# Patient Record
Sex: Male | Born: 1940 | Race: White | Hispanic: No | Marital: Married | Smoking: Former smoker
Health system: Southern US, Community
[De-identification: ages and names within clinical notes are randomized; demographics above are authoritative.]

## PROBLEM LIST (undated history)

## (undated) DIAGNOSIS — E7800 Pure hypercholesterolemia, unspecified: Secondary | ICD-10-CM

## (undated) DIAGNOSIS — I2510 Atherosclerotic heart disease of native coronary artery without angina pectoris: Secondary | ICD-10-CM

---

## 2013-01-14 ENCOUNTER — Other Ambulatory Visit: Payer: Self-pay

## 2013-01-14 ENCOUNTER — Emergency Department (HOSPITAL_COMMUNITY)
Admission: EM | Admit: 2013-01-14 | Discharge: 2013-01-15 | Disposition: A | Discharge status: Home / Self Care | Payer: 59 | Attending: Emergency Medicine | Admitting: Emergency Medicine

## 2013-01-14 ENCOUNTER — Encounter (HOSPITAL_COMMUNITY): Payer: Self-pay | Admitting: Emergency Medicine

## 2013-01-14 DIAGNOSIS — T63461A Toxic effect of venom of wasps, accidental (unintentional), initial encounter: Secondary | ICD-10-CM | POA: Insufficient documentation

## 2013-01-14 DIAGNOSIS — Z7982 Long term (current) use of aspirin: Secondary | ICD-10-CM | POA: Insufficient documentation

## 2013-01-14 DIAGNOSIS — Y9389 Activity, other specified: Secondary | ICD-10-CM | POA: Insufficient documentation

## 2013-01-14 DIAGNOSIS — Y92009 Unspecified place in unspecified non-institutional (private) residence as the place of occurrence of the external cause: Secondary | ICD-10-CM | POA: Insufficient documentation

## 2013-01-14 DIAGNOSIS — R21 Rash and other nonspecific skin eruption: Secondary | ICD-10-CM | POA: Insufficient documentation

## 2013-01-14 DIAGNOSIS — T782XXA Anaphylactic shock, unspecified, initial encounter: Secondary | ICD-10-CM

## 2013-01-14 DIAGNOSIS — Z87891 Personal history of nicotine dependence: Secondary | ICD-10-CM | POA: Insufficient documentation

## 2013-01-14 DIAGNOSIS — Z79899 Other long term (current) drug therapy: Secondary | ICD-10-CM | POA: Insufficient documentation

## 2013-01-14 DIAGNOSIS — T6391XA Toxic effect of contact with unspecified venomous animal, accidental (unintentional), initial encounter: Secondary | ICD-10-CM | POA: Insufficient documentation

## 2013-01-14 MED ORDER — FAMOTIDINE IN NACL 20-0.9 MG/50ML-% IV SOLN
20.0000 mg | Freq: Once | INTRAVENOUS | Status: AC
  Administered 2013-01-14: 20 mg via INTRAVENOUS
  Filled 2013-01-14: qty 50

## 2013-01-14 MED ORDER — EPINEPHRINE 0.3 MG/0.3ML IJ SOAJ: 0.3000 mg | INTRAMUSCULAR | Status: DC | PRN

## 2013-01-14 MED ORDER — PREDNISONE 20 MG PO TABS: ORAL_TABLET | ORAL | Status: DC

## 2013-01-14 MED ORDER — MORPHINE SULFATE 4 MG/ML IJ SOLN
4.0000 mg | Freq: Once | INTRAMUSCULAR | Status: AC
  Administered 2013-01-14: 4 mg via INTRAVENOUS
  Filled 2013-01-14: qty 1

## 2013-01-14 MED ORDER — FAMOTIDINE 40 MG PO TABS: 40.0000 mg | ORAL_TABLET | Freq: Two times a day (BID) | ORAL | Status: DC

## 2013-01-14 NOTE — ED Provider Notes (Signed)
CSN: [id number]     Arrival date & time 01/14/13  2055 History     First MD Initiated Contact with Patient 01/14/13 2114     Chief Complaint  Patient presents with  . Allergic Reaction   (Consider location/radiation/quality/duration/timing/severity/associated sxs/prior Treatment) HPI  Patient reports he had mowed the lawn and he was spreading the grass clippings around his azeleas when he suddenly was attacked by a bunch of ground bees. He states they followed into the house. He reports he was stung multiple times mainly on his arms. He relates about 5 minutes later he started feeling bad. His wife went to the drugstore to get Benadryl and when she came back 15 minutes later he was sitting in a chair and was able to take a Benadryl after which his eyes rolled back and one of his arms started shaking and his face got white and she thought his eyes looked swollen. She states that stopped and then it was repeated again. EMS was called. When they got there he was noted to have a blood pressure in the 70s with a heart rate of 50. They gave him a saline bolus and has received a total of 1700 cc of normal saline. He was given 50 mg of Benadryl prior to their arrival. EMS gave him 125 mg of Solu-Medrol, 50 mg of Zantac, another then 25 mg Benadryl and 8 mg of Zofran for nausea per my order while they were in route. Patient improved in transport. Patient states he remembers the events with his wife and does not think he had loss of consciousness. Wife is not sure. He states he's been stung before but never as many times or had a reaction before  History reviewed. No pertinent past medical history. History reviewed. No pertinent past surgical history. History reviewed. No pertinent family history. History  Substance Use Topics  . Smoking status: Former Smoker    Quit date: 06/04/1983  . Smokeless tobacco: Not on file  . 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    Allergies  Review of patient's allergies indicates no known allergies.  Home Medications   Current Outpatient Rx  Name  Route  Sig  Dispense  Refill  . aspirin EC 81 MG tablet   Oral   Take 81 mg by mouth daily.         . cyanocobalamin (,VITAMIN B-12,) 1000 MCG/ML injection   Intramuscular   Inject 1,000 mcg into the muscle once.         . Multiple Vitamin (MULTIVITAMIN WITH MINERALS) TABS tablet   Oral   Take 1 tablet by mouth daily.         . pravastatin (PRAVACHOL) 10 MG tablet   Oral   Take 10 mg by mouth every evening.         . Vitamin D, Ergocalciferol, (DRISDOL) 50000 UNITS CAPS capsule   Oral   Take 50,000 Units by mouth every 7 (seven) days. monday          BP 158/92  Pulse 82  Temp(Src) 97.5 F (36.4 C) (Oral)  Resp 18  SpO2 92%  Vital signs normal    Physical Exam  Nursing note and vitals reviewed. Constitutional: He is oriented to person, place, and time. He appears well-developed and well-nourished.  Non-toxic appearance. He does not appear ill. No distress.  HENT:  Head: Normocephalic and atraumatic.  Right Ear: External ear normal.  Left Ear: External ear normal.  Nose: Nose normal. No mucosal edema or rhinorrhea.  Mouth/Throat: Oropharynx is clear and moist and mucous membranes are normal. No dental abscesses or edematous.  Eyes: Conjunctivae and EOM are normal. Pupils are equal, round, and reactive to light.  Neck: Normal range of motion and full passive range of motion without pain. Neck supple.  Cardiovascular: Normal rate, regular rhythm and normal heart sounds.  Exam reveals no gallop and no friction rub.   No murmur heard. Pulmonary/Chest: Effort normal and breath sounds normal. No respiratory distress. He has no wheezes. He has no rhonchi. He has no rales. He exhibits no tenderness and no crepitus.  Abdominal: Soft. Normal appearance and bowel sounds are normal. He  exhibits no distension. There is no tenderness. There is no rebound and no guarding.  Musculoskeletal: Normal range of motion. He exhibits no edema and no tenderness.  Moves all extremities well.   Neurological: He is alert and oriented to person, place, and time. He has normal strength. No cranial nerve deficit.  Skin: Skin is warm, dry and intact. Rash noted. No erythema. No pallor.  Patient is noted to have coalescing redness of the skin mainly in his armpits, waistband, and knees, upper chest/neck area and scattered lesions on his face. He also has involvement of his arms with rare lesions on his lower leg except around the knees  Psychiatric: He has a normal mood and affect. His speech is normal and behavior is normal. His mood appears not anxious.    ED Course   Medications  famotidine (PEPCID) IVPB 20 mg (20 mg Intravenous New Bag/Given 01/14/13 2208)  morphine 4 MG/ML injection 4 mg (4 mg Intravenous Given 01/14/13 2208)     Procedures (including critical care time)  Recheck 22:30 sleeping, rash almost gone. Discussed using an epipen in the future.    Date: 01/14/2013  Rate: 83  Rhythm: normal sinus rhythm  QRS Axis: normal  Intervals: normal  ST/T Wave abnormalities: normal  Conduction Disutrbances:none  Narrative Interpretation:   Old EKG Reviewed: none available    1. Anaphylaxis, initial encounter   2. Bee sting, initial encounter     New Prescriptions   EPINEPHRINE (EPIPEN) 0.3 MG/0.3 ML SOAJ INJECTION    Inject 0.3 mL (0.3 mg total) into the muscle as needed.   FAMOTIDINE (PEPCID) 40 MG TABLET    Take 1 tablet (40 mg total) by mouth 2 (two) times daily.   PREDNISONE (DELTASONE) 20 MG TABLET    Take 3 po QD x 2d starting tomorrow, then 2 po QD x 3d then 1 po QD x 3d     Plan discharge   Devoria Albe, MD, FACEP   CRITICAL CARE Performed by: Devoria Albe L Total critical care time: 31 min Critical care time was exclusive of separately billable procedures and  treating other patients. Critical care was necessary to treat or prevent imminent or life-threatening deterioration. Critical care was time spent personally by me on the following activities: development of treatment plan with patient and/or surrogate as well as nursing, discussions with consultants, evaluation of patient's response to treatment, examination of patient, obtaining history from patient or surrogate, ordering and performing treatments and interventions, ordering and review of laboratory studies, ordering and review of radiographic studies, pulse oximetry and re-evaluation of patient's condition.     MDM    Ward Givens, MD 01/14/13 [phone]

## 2013-01-14 NOTE — ED Notes (Signed)
Pt arrived from home via EMS with a chief complaint of an allergic reaction to bee stings.  Pt stepped on a bee mound and was stung multiple times all over his body.  Per EMS upon arrival pt had a low blood pressure.   They administered of Normal saline before being able to get a blood pressure of 78/50 with a heart rate of 50.  Pt was continued with normal saline and has been given 1700 mL total and now has a BP of 127/80 and a heart rate of 82.  Pt was given 50,g PO Benadryl by his wife prior to arrival of EMS EMS administered 125mg  of solumedrol, 50mg  of zantac.  After contacting EDP they administered 25mg  benadryl and 8mg  Zofran.  Pt has welt marks on his body with a red rash.  Pt his able to talk and has a patent airway and appears in no apparent distress

## 2013-01-14 NOTE — ED Notes (Signed)
Bed: RESB Expected date:  Expected time:  Means of arrival:  Comments: EMS 72 yo M, multiple yellow jacket stings; Hives, IV hypotensive

## 2013-02-10 ENCOUNTER — Ambulatory Visit: Payer: Self-pay | Admitting: Gastroenterology

## 2013-06-10 ENCOUNTER — Ambulatory Visit: Payer: Self-pay | Admitting: Internal Medicine

## 2013-07-04 ENCOUNTER — Ambulatory Visit: Payer: Self-pay | Admitting: Internal Medicine

## 2013-08-01 ENCOUNTER — Ambulatory Visit: Payer: Self-pay | Admitting: Internal Medicine

## 2013-12-02 ENCOUNTER — Emergency Department (HOSPITAL_COMMUNITY): Payer: 59

## 2013-12-02 ENCOUNTER — Encounter (HOSPITAL_COMMUNITY): Payer: Self-pay | Admitting: Emergency Medicine

## 2013-12-02 ENCOUNTER — Emergency Department (HOSPITAL_COMMUNITY)
Admission: EM | Admit: 2013-12-02 | Discharge: 2013-12-02 | Disposition: A | Discharge status: Home / Self Care | Payer: 59 | Attending: Emergency Medicine | Admitting: Emergency Medicine

## 2013-12-02 DIAGNOSIS — S82009A Unspecified fracture of unspecified patella, initial encounter for closed fracture: Secondary | ICD-10-CM | POA: Insufficient documentation

## 2013-12-02 DIAGNOSIS — Y9389 Activity, other specified: Secondary | ICD-10-CM | POA: Insufficient documentation

## 2013-12-02 DIAGNOSIS — R296 Repeated falls: Secondary | ICD-10-CM | POA: Insufficient documentation

## 2013-12-02 DIAGNOSIS — Z79899 Other long term (current) drug therapy: Secondary | ICD-10-CM | POA: Insufficient documentation

## 2013-12-02 DIAGNOSIS — Z87891 Personal history of nicotine dependence: Secondary | ICD-10-CM | POA: Insufficient documentation

## 2013-12-02 DIAGNOSIS — S82002A Unspecified fracture of left patella, initial encounter for closed fracture: Secondary | ICD-10-CM

## 2013-12-02 DIAGNOSIS — Y929 Unspecified place or not applicable: Secondary | ICD-10-CM | POA: Insufficient documentation

## 2013-12-02 DIAGNOSIS — E7800 Pure hypercholesterolemia, unspecified: Secondary | ICD-10-CM | POA: Insufficient documentation

## 2013-12-02 HISTORY — DX: Pure hypercholesterolemia, unspecified: E78.00

## 2013-12-02 MED ORDER — HYDROCODONE-ACETAMINOPHEN 5-325 MG PO TABS: 1.0000 | ORAL_TABLET | Freq: Four times a day (QID) | ORAL | Status: DC | PRN

## 2013-12-02 NOTE — ED Notes (Signed)
Called Ortho to bedside for knee immoolizer

## 2013-12-02 NOTE — ED Provider Notes (Signed)
CSN: [id number]     Arrival date & time 12/02/13  1808 History   First MD Initiated Contact with Patient 12/02/13 1822     Chief Complaint  Patient presents with  . Knee Pain     (Consider location/radiation/quality/duration/timing/severity/associated sxs/prior Treatment) The history is provided by the patient. No language interpreter was used.  Richard Mclaughlin is a 73 y/o M with PMhx of high cholesterol presenting to the ED with left knee pain after a fall that occurred this morning. Patient reported that while he was taking out the trash he turned quickly, resulting in him to fall down 3 steps and land directly on his left knee on concrete. Patient described the left knee discomfort as a 5/10 when resting, but stated that when he flexes the knee and bears weight is when the pain is the worst and is described as a pulling sensation. Patient reported that he went to work after the event, but stated that he came home secondary to increased pain. Denied head injury, LOC, blurred vision, sudden loss of vision, neck pain, back pain, numbness, tingling, weakness. PCP Dr. Harrington [facility]  Past Medical History  Diagnosis Date  . High cholesterol    History reviewed. No pertinent past surgical history. No family history on file. History  Substance Use Topics  . Smoking status: Former Smoker    Quit date: 06/04/1983  . Smokeless tobacco: Not on file  . Alcohol Use: Yes    Review of Systems  Eyes: Negative for visual disturbance.  Musculoskeletal: Positive for arthralgias (left knee pain ). Negative for gait problem and neck pain.  Neurological: Negative for dizziness, weakness, numbness and headaches.      Allergies  Review of patient's allergies indicates no known allergies.  Home Medications   Prior to Admission medications   Medication Sig Start Date End Date Taking? Authorizing Provider  cyanocobalamin (,VITAMIN B-12,) 1000 MCG/ML injection Inject 1,000 mcg into the muscle once.   Yes  Historical Provider, MD  EPINEPHrine (EPIPEN) 0.3 mg/0.3 mL SOAJ injection Inject 0.3 mL (0.3 mg total) into the muscle as needed. 01/14/13  Yes Ward GivensIva L Knapp, MD  Multiple Vitamin (MULTIVITAMIN WITH MINERALS) TABS tablet Take 1 tablet by mouth daily.   Yes Historical Provider, MD  pravastatin (PRAVACHOL) 10 MG tablet Take 10 mg by mouth every evening.   Yes Historical Provider, MD  Vitamin D, Ergocalciferol, (DRISDOL) 50000 UNITS CAPS capsule Take 50,000 Units by mouth every 7 (seven) days.   Yes Historical Provider, MD  HYDROcodone-acetaminophen (NORCO/VICODIN) 5-325 MG per tablet Take 1 tablet by mouth every 6 (six) hours as needed for moderate pain or severe pain. 12/02/13   Kahealani Yankovich, PA-C   BP 129/70  Pulse 74  Temp(Src) 98.6 F (37 C) (Oral)  Resp 16  SpO2 93% Physical Exam  Nursing note and vitals reviewed. Constitutional: He is oriented to person, place, and time. He appears well-developed and well-nourished. No distress.  HENT:  Head: Normocephalic and atraumatic.  Eyes: Conjunctivae and EOM are normal. Right eye exhibits no discharge. Left eye exhibits no discharge.  Neck: Normal range of motion. Neck supple.  Cardiovascular: Normal rate, regular rhythm and normal heart sounds.  Exam reveals no friction rub.   No murmur heard. Pulses:      Radial pulses are 2+ on the right side, and 2+ on the left side.       Dorsalis pedis pulses are 2+ on the right side, and 2+ on the left side.  Cap  refill < 3 seconds Skin warm to touch. Negative necrotic tissue noted.   Pulmonary/Chest: Effort normal and breath sounds normal. No respiratory distress. He has no wheezes. He has no rales.  Musculoskeletal: He exhibits edema and tenderness.       Left knee: He exhibits decreased range of motion, swelling and deformity. He exhibits no effusion, no ecchymosis, no laceration, no erythema, normal alignment and no LCL laxity. Tenderness found. Patellar tendon tenderness noted.        Legs: Swelling noted to the suprapatellar region of the left knee with negative ecchymosis, erythema, inflammation, warmth upon palpation. Mild deformity noted with the patella slightly inferior position when compared to the right knee. Patient is able to flex and extend, most comfortable when the knee is extended. Valgus and varus tension negative. Negative anterior and posterior draw sign. Patient able to flex the left hip - discomfort with flexion of the left hip noted. Full ROM to the left ankle, digits of the left foot.  Neurological: He is alert and oriented to person, place, and time. No cranial nerve deficit. He exhibits normal muscle tone. Coordination normal.  Cranial nerves III-XII grossly intact Strength 5+/5+ to lower extremities bilaterally with resistance applied, equal distribution noted Strength intact to digits of the feet bilaterally Sensation intact with differentiation to sharp and dull touch Negative facial drooping Negative slurred speech  Negative aphasia  Skin: Skin is warm and dry. No rash noted. He is not diaphoretic. No erythema.  Psychiatric: He has a normal mood and affect. His behavior is normal. Thought content normal.    ED Course  Procedures (including critical care time)  7:58 PM Patient seen and assessed by attending physician, Dr. Cheri Rous. Yao who recommended patient to be placed in knee immobilizer and for patient to be discharged home with pain medications. Recommended patient to be seen by Orthopedics as outpatient.   Labs Review Labs Reviewed - No data to display  Imaging Review Dg Knee Complete 4 Views Left  12/02/2013   CLINICAL DATA:  Left knee pain  EXAM: LEFT KNEE - COMPLETE 4+ VIEW  COMPARISON:  None.  FINDINGS: Minimally displaced vertical fracture along the lateral aspect of the patella.  No additional fracture is seen.  Associated large suprapatellar knee joint effusion.  IMPRESSION: Minimally displaced vertical fracture along the lateral aspect of  the patella.  Associated large suprapatellar knee joint effusion.   Electronically Signed   By: Charline BillsSriyesh  Krishnan M.D.   On: 12/02/2013 19:44     EKG Interpretation None      MDM   Final diagnoses:  Patella fracture, left, closed, initial encounter    Medications - No data to display  Filed Vitals:   12/02/13 1817 12/02/13 2055  BP: 152/63 129/70  Pulse: 81 74  Temp: 98.4 F (36.9 C) 98.6 F (37 C)  TempSrc: Oral Oral  Resp: 18 16  SpO2: 96% 93%   Patient presenting to the ED after a fall that occurred this morning, resulting in left knee pain. Denied head injury, LOC, blurred vision, sudden loss of vision.  Plain film of left knee noted minimally displaced vertical fracture along the lateral aspect of the patella with associated large suprapatellar joint effusion. Negative focal neurological deficits noted. Sensation intact. Pulses palpable and strong-DP and PT. Cap refill less than 3 seconds. Full range of motion to the left ankle and digits of left foot. Sensation intact. Warmth upon palpation to skin with negative skin color changes. Patient seen and assessed  by attending physician, Dr. Cheri Rous who recommended patient to be discharged home with a knee immobilizer and for patient to follow up as outpatient with Orthopedics.  Doubt compartment syndrome. Doubt ischemia. Patient presenting with fracture to the left patella - minimally displaced. Patient placed in knee immobilizer with crutches. Patient stable, afebrile. Patient not septic appearing. Discharged patient. Discussed with patient to rest, ice, elevate. Discussed with patient to keep knee in immobilizer at all times. Referred to PCP and orthopedics. Discussed with patient to avoid any physical or strenuous activity. Discussed with patient to closely monitor symptoms and if symptoms are to worsen or change to report back to the ED - strict return instructions given.  Patient agreed to plan of care, understood, all questions  answered.   Raymon Mutton, PA-C 12/03/13 [phone]

## 2013-12-02 NOTE — ED Notes (Signed)
Pt was taking out two bags of trash earlier today and was trying to open the garage door by pushing the button and fell down few stairs.  Pt c/o left knee pain.  Pt went on to work but left due to pain. Pt went home and rested with ice pack but pain is still persisting.

## 2013-12-02 NOTE — Discharge Instructions (Signed)
Please call your doctor for a followup appointment within 24-48 hours. When you talk to your doctor please let them know that you were seen in the emergency department and have them acquire all of your records so that they can discuss the findings with you and formulate a treatment plan to fully care for your new and ongoing problems. Please call and set-up an appointment with your primary care provider Please call and set-up an appointment with Orthopedics - please call on Monday morning Please take pain medications as prescribed - while on pain medications there is to be no drinking alcohol, driving, operating any heavy machinery. If extra please dispose in a proper manner. Please do not take any extra Tylenol with this medication for this can lead to Tylenol overdose and liver issues.  Please rest and stay hydrated Please rest, ice, and elevate Please keep knee in immobilizer at all times Please do not bear weight on the leg Please no physical or strenuous activity Please continue to monitor symptoms closely and if symptoms are to worsen or change (fever greater than 101, chills, sweating, nausea, vomiting, chest pain, shortness of breath, difficulty breathing, numbness, tingling, loss of sensation, worsening or changes to pain pattern, changes to skin color of the knee and toes, loss of sensation, fall, injury) please report back to the ED immediately    Knee Fracture, Adult A knee fracture is a break in any of the bones of the lower part of the thigh bone, the upper part of the bones of the lower leg, or of the kneecap. When the bones no longer meet the way they are supposed to it is called a dislocation. Sometimes there can be a dislocation along with fractures. SYMPTOMS  Symptoms may include pain, swelling, inability to bend the knee, deformity of the knee, and inability to walk.  DIAGNOSIS  This problem is usually diagnosed with x-rays. Special studies are sometimes done if a fracture is  suspected but cannot be seen on ordinary x-rays. If vessels around the knee are injured, special tests may be done to see what the damage is. TREATMENT   The leg is usually splinted for the first couple of days to allow for swelling. After the swelling is down a cast is put on. Sometimes a cast is put on right away with the sides of the cast cut to allow the knee to swell. If the bones are in place, this may be all that is needed.  If the bones are out of place, medications for pain are given to allow them to be put back in place. If they are seriously out of place, surgery may be needed to hold the pieces or breaks in place using wires, pins, screws or metal plates.  Generally most fractures will heal in 4 to 6 weeks. HOME CARE INSTRUCTIONS   Use your crutches as directed.  To lessen the swelling, keep the injured leg elevated while sitting or lying down.  Apply ice to the injury for 15-20 minutes, 03-04 times per day while awake for 2 days. Put the ice in a plastic bag and place a thin towel between the bag of ice and your cast.  If you have a plaster or fiberglass cast:  Do not try to scratch the skin under the cast using sharp or pointed objects.  Check the skin around the cast every day. You may put lotion on any red or sore areas.  Keep your cast dry and clean.  If you have  a plaster splint:  Wear the splint as directed.  You may loosen the elastic around the splint if your toes become numb, tingle, or turn cold or blue.  Do not put pressure on any part of your cast or splint; it may break. Rest your cast only on a pillow the first 24 hours until it is fully hardened.  Your cast or splint can be protected during bathing with a plastic bag. Do not lower the cast or splint into water.  Only take over-the-counter or prescription medicines for pain, discomfort, or fever as directed by your caregiver.  See your caregiver soon if your cast gets damaged or breaks.  It is very  important to keep all follow up appointments. Not following up as directed may result in a worsening of your condition or a failure of the fracture to heal properly. SEEK IMMEDIATE MEDICAL CARE IF:  You have continued severe pain.  You have more swelling than you did before the cast was put on.  The area below the fracture becomes painful.  Your skin or toenails below the injury turn blue or gray, or feel cold or numb.  There is drainage coming from under the cast.  New, unexplained symptoms develop (drugs used in treatment may produce side effects). MAKE SURE YOU:   Understand these instructions.  Will watch your condition.  Will get help right away if you are not doing well or get worse. Document Released: 04/02/2006 Document Revised: 08/12/2011 Document Reviewed: 05/04/2007 [facility] Patient Information 2015 [employer], [place]. This information is not intended to replace advice given to you by your health care provider. Make sure you discuss any questions you have with your health care provider.

## 2013-12-06 NOTE — ED Provider Notes (Signed)
Medical screening examination/treatment/procedure(s) were conducted as a shared visit with non-physician practitioner(s) and myself.  I personally evaluated the patient during the encounter.   EKG Interpretation None      [name] is a 73 y.o. male here with L knee pain. Walking and fell on L knee. On exam, obvious effusion L knee. Able to lift leg off the bed. Tenderness on patella. Xray showed patella fracture. Patient placed in knee immobilizer and crutches. Will give ortho f/u.    Richardean Canalavid H Alyssabeth Bruster, MD 12/06/13 1556

## 2015-10-02 ENCOUNTER — Emergency Department (HOSPITAL_COMMUNITY)
Admission: EM | Admit: 2015-10-02 | Discharge: 2015-10-02 | Disposition: A | Discharge status: Home / Self Care | Payer: 59 | Attending: Emergency Medicine | Admitting: Emergency Medicine

## 2015-10-02 ENCOUNTER — Emergency Department (HOSPITAL_COMMUNITY): Payer: 59

## 2015-10-02 ENCOUNTER — Encounter (HOSPITAL_COMMUNITY): Payer: Self-pay

## 2015-10-02 DIAGNOSIS — Z79899 Other long term (current) drug therapy: Secondary | ICD-10-CM | POA: Diagnosis not present

## 2015-10-02 DIAGNOSIS — Z87891 Personal history of nicotine dependence: Secondary | ICD-10-CM | POA: Diagnosis not present

## 2015-10-02 DIAGNOSIS — N201 Calculus of ureter: Secondary | ICD-10-CM | POA: Insufficient documentation

## 2015-10-02 DIAGNOSIS — E7800 Pure hypercholesterolemia, unspecified: Secondary | ICD-10-CM | POA: Insufficient documentation

## 2015-10-02 DIAGNOSIS — R1032 Left lower quadrant pain: Secondary | ICD-10-CM | POA: Diagnosis present

## 2015-10-02 DIAGNOSIS — Z7982 Long term (current) use of aspirin: Secondary | ICD-10-CM | POA: Diagnosis not present

## 2015-10-02 DIAGNOSIS — Z79891 Long term (current) use of opiate analgesic: Secondary | ICD-10-CM | POA: Diagnosis not present

## 2015-10-02 LAB — CBC WITH DIFFERENTIAL/PLATELET
Basophils Absolute: 0 10*3/uL (ref 0.0–0.1)
Basophils Relative: 0 %
Eosinophils Absolute: 0 10*3/uL (ref 0.0–0.7)
Eosinophils Relative: 0 %
HEMATOCRIT: 42.3 % (ref 39.0–52.0)
HEMOGLOBIN: 14.9 g/dL (ref 13.0–17.0)
LYMPHS ABS: 1.4 10*3/uL (ref 0.7–4.0)
Lymphocytes Relative: 13 %
MCH: 31.8 pg (ref 26.0–34.0)
MCHC: 35.2 g/dL (ref 30.0–36.0)
MCV: 90.4 fL (ref 78.0–100.0)
MONO ABS: 0.4 10*3/uL (ref 0.1–1.0)
MONOS PCT: 4 %
NEUTROS ABS: 8.5 10*3/uL — AB (ref 1.7–7.7)
Neutrophils Relative %: 83 %
Platelets: 198 10*3/uL (ref 150–400)
RBC: 4.68 MIL/uL (ref 4.22–5.81)
RDW: 12.7 % (ref 11.5–15.5)
WBC: 10.3 10*3/uL (ref 4.0–10.5)

## 2015-10-02 LAB — COMPREHENSIVE METABOLIC PANEL
ALT: 21 U/L (ref 17–63)
ANION GAP: 13 (ref 5–15)
AST: 30 U/L (ref 15–41)
Albumin: 4.4 g/dL (ref 3.5–5.0)
Alkaline Phosphatase: 64 U/L (ref 38–126)
BUN: 24 mg/dL — ABNORMAL HIGH (ref 6–20)
CHLORIDE: 104 mmol/L (ref 101–111)
CO2: 24 mmol/L (ref 22–32)
Calcium: 9.3 mg/dL (ref 8.9–10.3)
Creatinine, Ser: 1.44 mg/dL — ABNORMAL HIGH (ref 0.61–1.24)
GFR calc Af Amer: 54 mL/min — ABNORMAL LOW (ref >60)
GFR calc non Af Amer: 46 mL/min — ABNORMAL LOW (ref >60)
Glucose, Bld: 166 mg/dL — ABNORMAL HIGH (ref 65–99)
Potassium: 4 mmol/L (ref 3.5–5.1)
SODIUM: 141 mmol/L (ref 135–145)
Total Bilirubin: 1 mg/dL (ref 0.3–1.2)
Total Protein: 7.7 g/dL (ref 6.5–8.1)

## 2015-10-02 LAB — URINALYSIS, ROUTINE W REFLEX MICROSCOPIC
BILIRUBIN URINE: NEGATIVE
GLUCOSE, UA: 100 mg/dL — AB
HGB URINE DIPSTICK: NEGATIVE
KETONES UR: 40 mg/dL — AB
Leukocytes, UA: NEGATIVE
Nitrite: NEGATIVE
PH: 5.5 (ref 5.0–8.0)
Protein, ur: NEGATIVE mg/dL
SPECIFIC GRAVITY, URINE: 1.019 (ref 1.005–1.030)

## 2015-10-02 MED ORDER — TAMSULOSIN HCL 0.4 MG PO CAPS: 0.4000 mg | ORAL_CAPSULE | Freq: Every day | ORAL | Status: DC

## 2015-10-02 MED ORDER — DIATRIZOATE MEGLUMINE & SODIUM 66-10 % PO SOLN
30.0000 mL | Freq: Once | ORAL | Status: AC
  Administered 2015-10-02: 30 mL via ORAL

## 2015-10-02 MED ORDER — SODIUM CHLORIDE 0.9 % IV BOLUS (SEPSIS)
1000.0000 mL | Freq: Once | INTRAVENOUS | Status: AC
  Administered 2015-10-02: 1000 mL via INTRAVENOUS

## 2015-10-02 MED ORDER — HYDROMORPHONE HCL 1 MG/ML IJ SOLN
1.0000 mg | Freq: Once | INTRAMUSCULAR | Status: AC
  Administered 2015-10-02: 1 mg via INTRAVENOUS
  Filled 2015-10-02: qty 1

## 2015-10-02 MED ORDER — PROCHLORPERAZINE EDISYLATE 5 MG/ML IJ SOLN
10.0000 mg | Freq: Once | INTRAMUSCULAR | Status: AC
  Administered 2015-10-02: 10 mg via INTRAVENOUS
  Filled 2015-10-02: qty 2

## 2015-10-02 MED ORDER — ONDANSETRON HCL 4 MG PO TABS: 4.0000 mg | ORAL_TABLET | Freq: Four times a day (QID) | ORAL | Status: DC

## 2015-10-02 MED ORDER — OXYCODONE-ACETAMINOPHEN 5-325 MG PO TABS: 1.0000 | ORAL_TABLET | ORAL | Status: DC | PRN

## 2015-10-02 MED ORDER — OXYCODONE-ACETAMINOPHEN 5-325 MG PO TABS: 2.0000 | ORAL_TABLET | ORAL | Status: DC | PRN

## 2015-10-02 MED ORDER — MORPHINE SULFATE (PF) 4 MG/ML IV SOLN
4.0000 mg | Freq: Once | INTRAVENOUS | Status: AC
  Administered 2015-10-02: 4 mg via INTRAVENOUS
  Filled 2015-10-02: qty 1

## 2015-10-02 NOTE — ED Notes (Addendum)
Per EMS-LLQ pain for 2 hours-N/V-urinary urgency-last void was at 1200 pm-4 mg of Zofran given in route

## 2015-10-02 NOTE — ED Provider Notes (Signed)
CSN: [id number]     Arrival date & time 10/02/15  1352 History   First MD Initiated Contact with Patient 10/02/15 1356     Chief Complaint  Patient presents with  . Abdominal Pain   PT DEVELOPED LLQ PAIN ABOUT 2 HRS PTA.  HE ALSO FELT LIKE HE CAN'T URINATE.  HE WAS GIVEN 4 MG OF ZOFRAN BY EMS AND THAT HAS NOT HELPED HIS N/V.  THE PT HAS NEVER HAD ANYTHING LIKE THIS IN THE PAST.  (Consider location/radiation/quality/duration/timing/severity/associated sxs/prior Treatment) Patient is a 75 y.o. male presenting with abdominal pain. The history is provided by the patient and the EMS personnel.  Abdominal Pain Pain location:  L flank and LLQ Pain quality: sharp   Pain radiates to:  Does not radiate Pain severity:  Moderate Onset quality:  Sudden Timing:  Constant Progression:  Worsening Associated symptoms: nausea and vomiting     Past Medical History  Diagnosis Date  . High cholesterol    History reviewed. No pertinent past surgical history. History reviewed. No pertinent family history. Social History  Substance Use Topics  . Smoking status: Former Smoker    Quit date: 06/04/1983  . Smokeless tobacco: None  . Alcohol Use: Yes    Review of Systems  Gastrointestinal: Positive for nausea, vomiting and abdominal pain.  All other systems reviewed and are negative.     Allergies  Azithromycin and Simvastatin  Home Medications   Prior to Admission medications   Medication Sig Start Date End Date Taking? Authorizing Provider  aspirin EC 81 MG tablet Take 81 mg by mouth daily.   Yes Historical Provider, MD  cyanocobalamin (,VITAMIN B-12,) 1000 MCG/ML injection Inject 1,000 mcg into the muscle every 30 (thirty) days.    Yes Historical Provider, MD  EPINEPHrine (EPIPEN) 0.3 mg/0.3 mL SOAJ injection Inject 0.3 mL (0.3 mg total) into the muscle as needed. 01/14/13  Yes Devoria AlbeIva Knapp, MD  Multiple Vitamin (MULTIVITAMIN WITH MINERALS) TABS tablet Take 1 tablet by mouth daily.   Yes  Historical Provider, MD  pravastatin (PRAVACHOL) 10 MG tablet Take 10 mg by mouth every evening.   Yes Historical Provider, MD  Vitamin D, Ergocalciferol, (DRISDOL) 50000 units CAPS capsule Take 50,000 Units by mouth every 14 (fourteen) days. 09/30/15  Yes Historical Provider, MD  oxyCODONE-acetaminophen (PERCOCET/ROXICET) 5-325 MG tablet Take 1 tablet by mouth every 4 (four) hours as needed for severe pain. 10/02/15   Jacalyn LefevreJulie Aerielle Stoklosa, MD  tamsulosin (FLOMAX) 0.4 MG CAPS capsule Take 1 capsule (0.4 mg total) by mouth daily. 10/02/15   Jacalyn LefevreJulie Mariama Saintvil, MD   BP 189/88 mmHg  Pulse 54  Temp(Src) 98.1 F (36.7 C) (Oral)  Resp 14  SpO2 97% Physical Exam  Constitutional: He is oriented to person, place, and time. He appears well-developed and well-nourished.  HENT:  Head: Normocephalic and atraumatic.  Right Ear: External ear normal.  Left Ear: External ear normal.  Nose: Nose normal.  Mouth/Throat: Oropharynx is clear and moist.  Eyes: Conjunctivae and EOM are normal. Pupils are equal, round, and reactive to light.  Neck: Normal range of motion. Neck supple.  Cardiovascular: Normal rate, regular rhythm, normal heart sounds and intact distal pulses.   Pulmonary/Chest: Effort normal and breath sounds normal.  Abdominal: Soft. Bowel sounds are normal. There is tenderness in the left lower quadrant.  Musculoskeletal: Normal range of motion.  Neurological: He is alert and oriented to person, place, and time.  Skin: Skin is warm and dry.  Psychiatric: He has a normal  mood and affect. His behavior is normal. Thought content normal.  Nursing note and vitals reviewed.   ED Course  Procedures (including critical care time) Labs Review Labs Reviewed  COMPREHENSIVE METABOLIC PANEL - Abnormal; Notable for the following:    Glucose, Bld 166 (*)    BUN 24 (*)    Creatinine, Ser 1.44 (*)    GFR calc non Af Amer 46 (*)    GFR calc Af Amer 54 (*)    All other components within normal limits  CBC WITH  DIFFERENTIAL/PLATELET - Abnormal; Notable for the following:    Neutro Abs 8.5 (*)    All other components within normal limits  URINALYSIS, ROUTINE W REFLEX MICROSCOPIC (NOT AT [facility]) - Abnormal; Notable for the following:    Glucose, UA 100 (*)    Ketones, ur 40 (*)    All other components within normal limits    Imaging Review Ct Abdomen Pelvis Wo Contrast  10/02/2015  CLINICAL DATA:  Left lower quadrant pain for 2 hours. EXAM: CT ABDOMEN AND PELVIS WITHOUT CONTRAST TECHNIQUE: Multidetector CT imaging of the abdomen and pelvis was performed following the standard protocol without IV contrast. COMPARISON:  None. FINDINGS: Lower chest: There is pulmonary vascular congestion at the lung bases but there is no pulmonary edema and there are no effusions. Heart size is normal. Hepatobiliary: 7 mm low-density lesion in the dome of the right lobe of the liver, probably a benign cyst. Liver parenchyma is otherwise normal. Biliary tree is normal. Pancreas: Normal. Spleen: Normal. Adrenals/Urinary Tract: 3 mm stone obstructing the left ureter at the left ureterovesical junction. Perinephric soft tissue stranding extending along the slightly distended left ureter into the left side of the pelvis. No renal calculi. The adrenal glands are normal. Bladder is normal. Stomach/Bowel: Normal. Vascular/Lymphatic: Aortic atherosclerosis.  No adenopathy. Reproductive: Negative. Other: No free air. Musculoskeletal: No significant abnormality. IMPRESSION: 3 mm stone at the left ureterovesical junction creating left hydronephrosis and extravasation of urine along the left ureter. Slight pulmonary vascular congestion the lung bases. Aortic atherosclerosis. Electronically Signed   By: Francene Boyers M.D.   On: 10/02/2015 15:38   I have personally reviewed and evaluated these images and lab results as part of my medical decision-making.   EKG Interpretation None      MDM  PT'S PAIN IS GONE.  HE KNOWS TO RETURN IF WORSE.   PT D/W DR. Sherryl Barters (UROLOGY) ABOUT CT SCAN SHOWING EXTRAVASATION OF URINE.  HE SAID THAT IF PT'S PAIN IS IMPROVED, OK TO F/U AS AN OUTPATIENT.    Final diagnoses:  Left ureteral stone      Jacalyn Lefevre, MD 10/02/15 1731

## 2015-10-02 NOTE — ED Notes (Signed)
Pt is aware we need a urine sample. He states he hasnt been able to go since around lunch time.

## 2015-10-02 NOTE — ED Notes (Signed)
Family member requesting more pain med for patient. Provider notified. Awaiting orders.

## 2015-10-02 NOTE — Discharge Instructions (Signed)
Kidney Stones °Kidney stones (urolithiasis) are deposits that form inside your kidneys. The intense pain is caused by the stone moving through the urinary tract. When the stone moves, the ureter goes into spasm around the stone. The stone is usually passed in the urine.  °CAUSES  °· A disorder that makes certain neck glands produce too much parathyroid hormone (primary hyperparathyroidism). °· A buildup of uric acid crystals, similar to gout in your joints. °· Narrowing (stricture) of the ureter. °· A kidney obstruction present at birth (congenital obstruction). °· Previous surgery on the kidney or ureters. °· Numerous kidney infections. °SYMPTOMS  °· Feeling sick to your stomach (nauseous). °· Throwing up (vomiting). °· Blood in the urine (hematuria). °· Pain that usually spreads (radiates) to the groin. °· Frequency or urgency of urination. °DIAGNOSIS  °· Taking a history and physical exam. °· Blood or urine tests. °· CT scan. °· Occasionally, an examination of the inside of the urinary bladder (cystoscopy) is performed. °TREATMENT  °· Observation. °· Increasing your fluid intake. °· Extracorporeal shock wave lithotripsy--This is a noninvasive procedure that uses shock waves to break up kidney stones. °· Surgery may be needed if you have severe pain or persistent obstruction. There are various surgical procedures. Most of the procedures are performed with the use of small instruments. Only small incisions are needed to accommodate these instruments, so recovery time is minimized. °The size, location, and chemical composition are all important variables that will determine the proper choice of action for you. Talk to your health care provider to better understand your situation so that you will minimize the risk of injury to yourself and your kidney.  °HOME CARE INSTRUCTIONS  °· Drink enough water and fluids to keep your urine clear or pale yellow. This will help you to pass the stone or stone fragments. °· Strain  all urine through the provided strainer. Keep all particulate matter and stones for your health care provider to see. The stone causing the pain may be as small as a grain of salt. It is very important to use the strainer each and every time you pass your urine. The collection of your stone will allow your health care provider to analyze it and verify that a stone has actually passed. The stone analysis will often identify what you can do to reduce the incidence of recurrences. °· Only take over-the-counter or prescription medicines for pain, discomfort, or fever as directed by your health care provider. °· Keep all follow-up visits as told by your health care provider. This is important. °· Get follow-up X-rays if required. The absence of pain does not always mean that the stone has passed. It may have only stopped moving. If the urine remains completely obstructed, it can cause loss of kidney function or even complete destruction of the kidney. It is your responsibility to make sure X-rays and follow-ups are completed. Ultrasounds of the kidney can show blockages and the status of the kidney. Ultrasounds are not associated with any radiation and can be performed easily in a matter of minutes. °· Make changes to your daily diet as told by your health care provider. You may be told to: °¨ Limit the amount of salt that you eat. °¨ Eat 5 or more servings of fruits and vegetables each day. °¨ Limit the amount of meat, poultry, fish, and eggs that you eat. °· Collect a 24-hour urine sample as told by your health care provider. You may need to collect another urine sample every 6-12   months. °SEEK MEDICAL CARE IF: °· You experience pain that is progressive and unresponsive to any pain medicine you have been prescribed. °SEEK IMMEDIATE MEDICAL CARE IF:  °· Pain cannot be controlled with the prescribed medicine. °· You have a fever or shaking chills. °· The severity or intensity of pain increases over 18 hours and is not  relieved by pain medicine. °· You develop a new onset of abdominal pain. °· You feel faint or pass out. °· You are unable to urinate. °  °This information is not intended to replace advice given to you by your health care provider. Make sure you discuss any questions you have with your health care provider. °  °Document Released: 05/20/2005 Document Revised: 02/08/2015 Document Reviewed: 10/21/2012 °Elsevier Interactive Patient Education ©2016 [employer] ° °

## 2017-05-27 IMAGING — CT CT ABD-PELV W/O CM
2 of 4 series · 16 of 46 positions shown, 18 images · non-contrast
Comparison: None.

CLINICAL DATA: Left lower quadrant pain for 2 hours.

EXAM:
CT ABDOMEN AND PELVIS WITHOUT CONTRAST
TECHNIQUE: Multidetector CT imaging of the abdomen and pelvis was performed
following the standard protocol without IV contrast.

[Series 2: abd/pel w/o · axial · non-contrast · 0.94mm/px · z∈[+1101,+1571]mm · 13 of 106 slices shown, 15 images]
[im 6/106  soft-tissue]
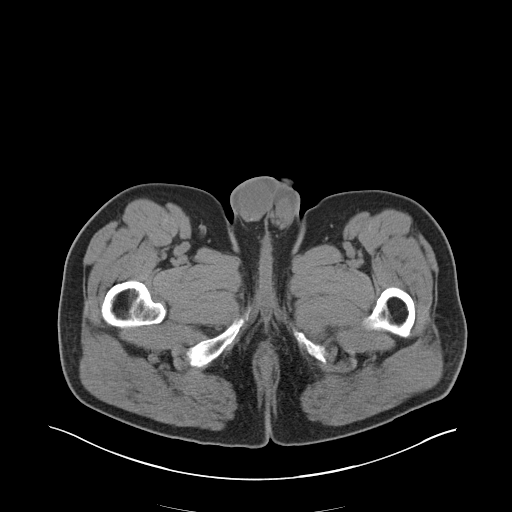
[im 6/106  bone]
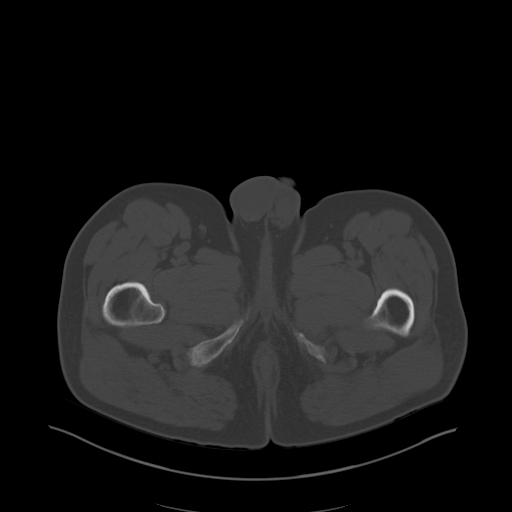
[im 16/106  soft-tissue]
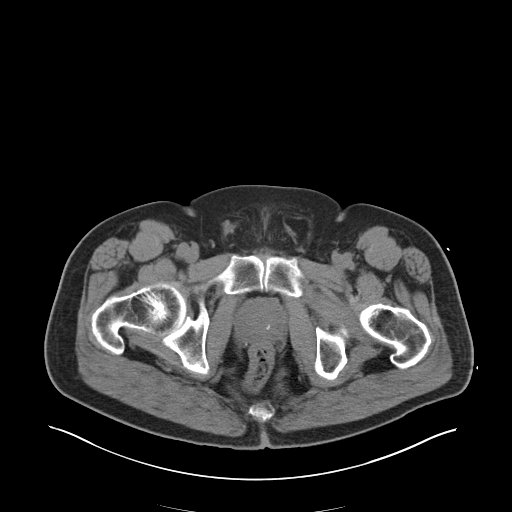
[im 22/106  soft-tissue]
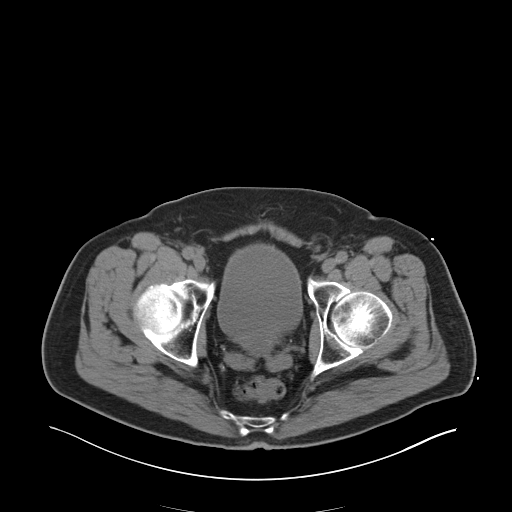
[im 32/106  soft-tissue]
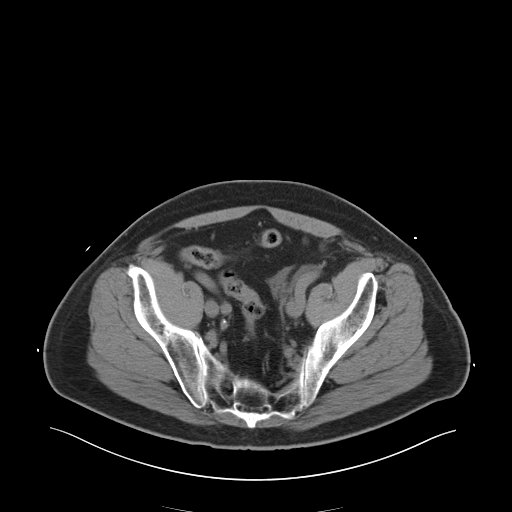
[im 37/106  soft-tissue]
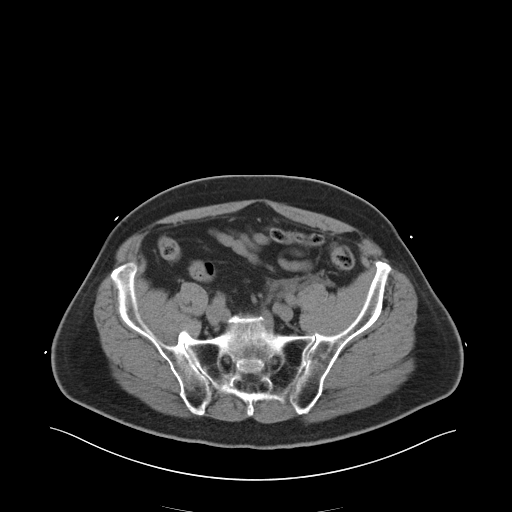
[im 48/106  soft-tissue]
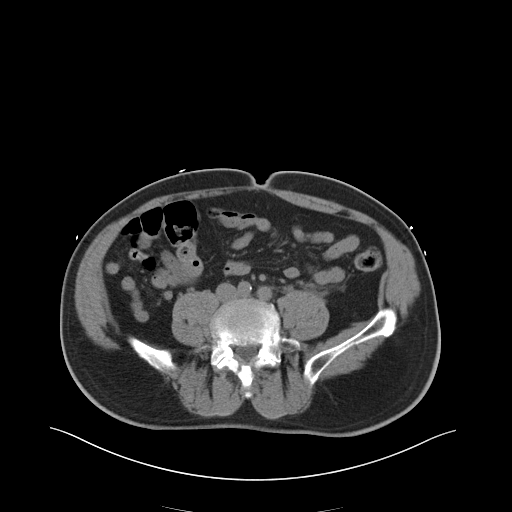
[im 53/106  soft-tissue]
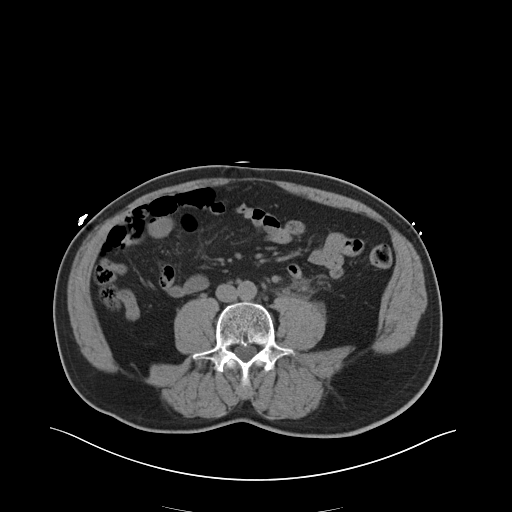
[im 58/106  soft-tissue]
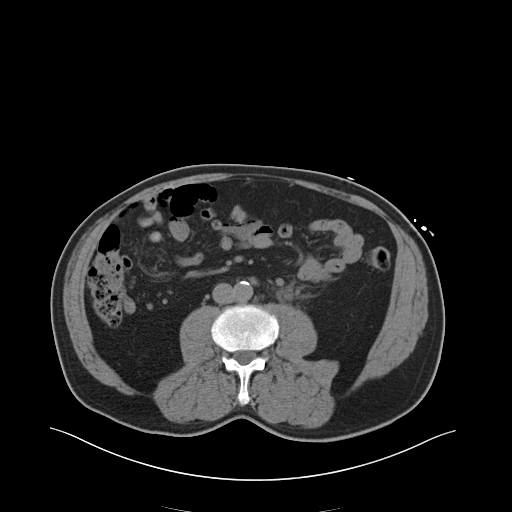
[im 69/106  soft-tissue]
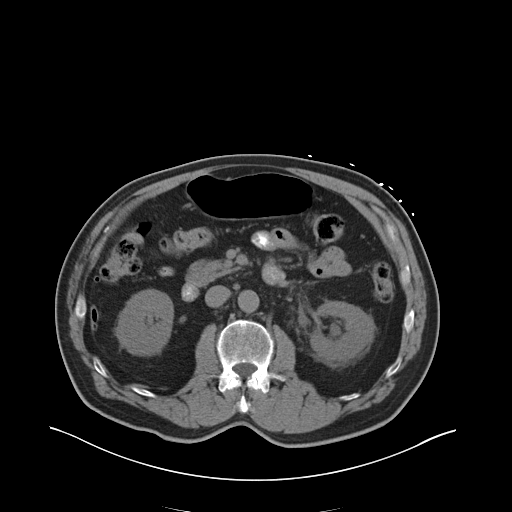
[im 69/106  bone]
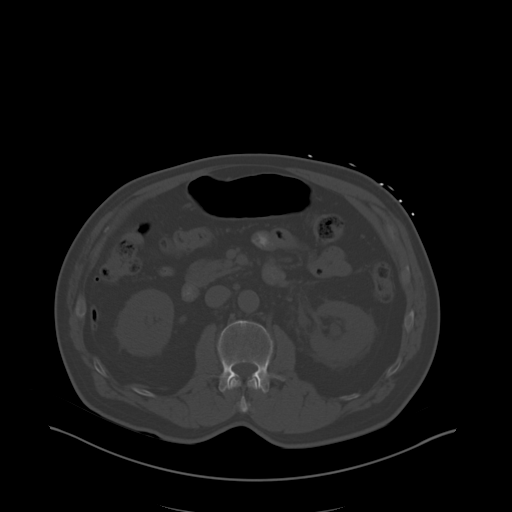
[im 74/106  soft-tissue]
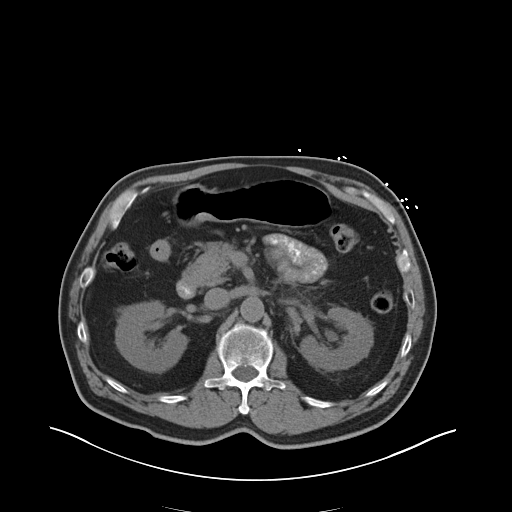
[im 85/106  soft-tissue]
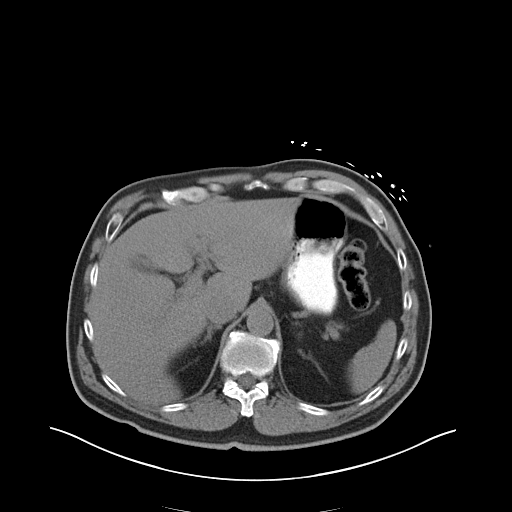
[im 90/106  soft-tissue]
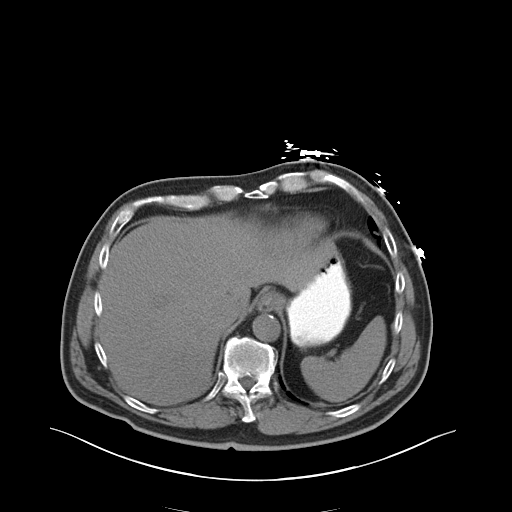
[im 100/106  soft-tissue]
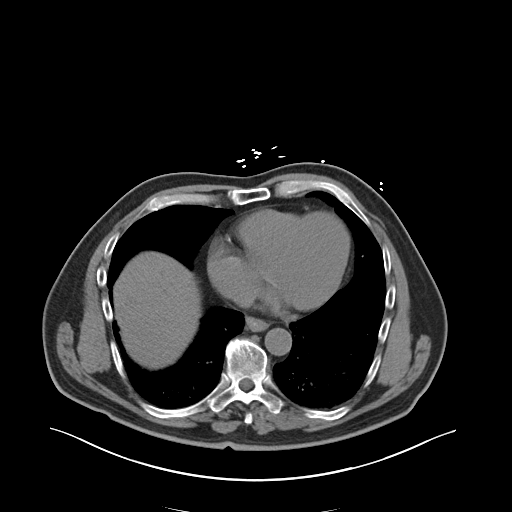

[Series 5: coronal · coronal · 0.74mm/px · 3 of 155 slices shown]
[im 52/155  soft-tissue]
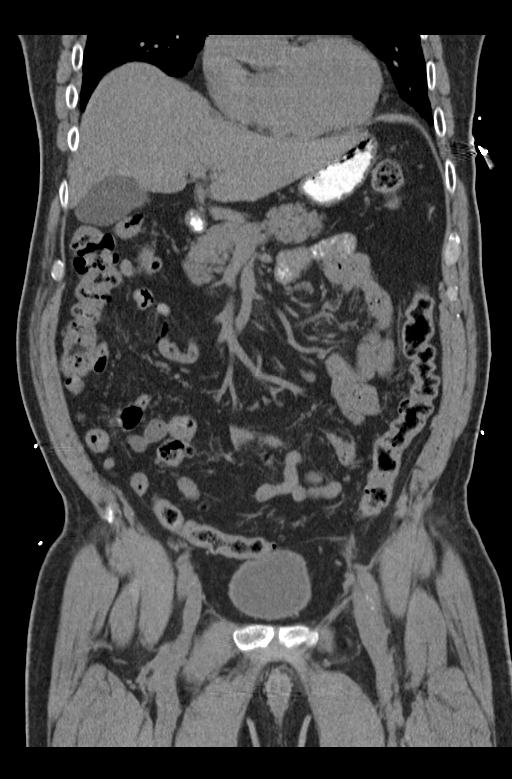
[im 69/155  soft-tissue]
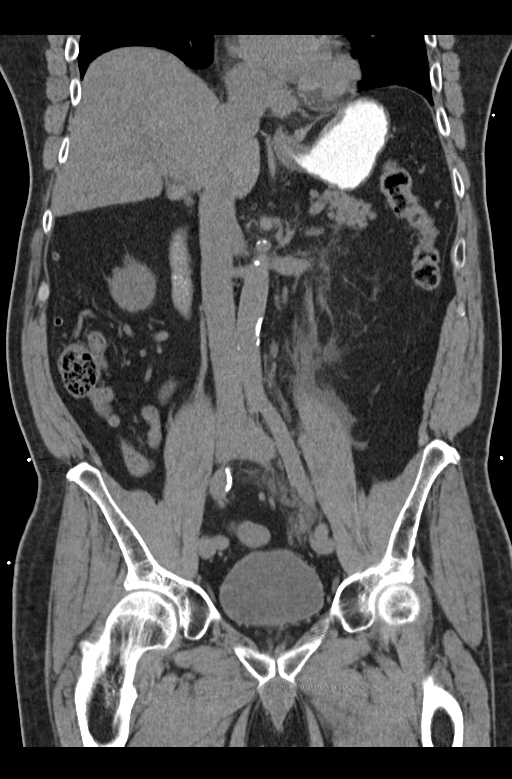
[im 86/155  soft-tissue]
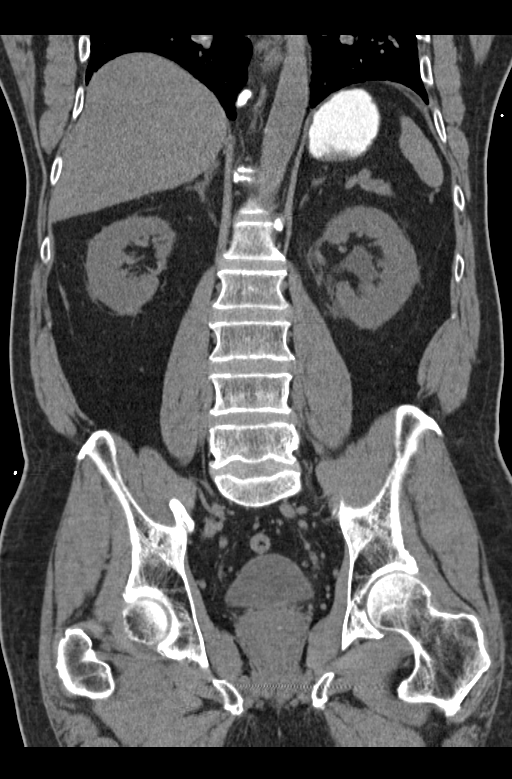

[16 of 46 positions shown; findings below may reference images not displayed]

FINDINGS: Lower chest: There is pulmonary vascular congestion at the lung
bases but there is no pulmonary edema and there are no effusions.
Heart size is normal.

Hepatobiliary: 7 mm low-density lesion in the dome of the right lobe
of the liver, probably a benign cyst. Liver parenchyma is otherwise
normal. Biliary tree is normal.

Pancreas: Normal.

Spleen: Normal.

Adrenals/Urinary Tract: 3 mm stone obstructing the left ureter at
the left ureterovesical junction. Perinephric soft tissue stranding
extending along the slightly distended left ureter into the left
side of the pelvis. No renal calculi. The adrenal glands are normal.
Bladder is normal.

Stomach/Bowel: Normal.

Vascular/Lymphatic: Aortic atherosclerosis.  No adenopathy.

Reproductive: Negative.

Other: No free air.

Musculoskeletal: No significant abnormality.
IMPRESSION: 3 mm stone at the left ureterovesical junction creating left
hydronephrosis and extravasation of urine along the left ureter.

Slight pulmonary vascular congestion the lung bases.

Aortic atherosclerosis.

## 2023-09-18 ENCOUNTER — Encounter: Payer: Self-pay | Admitting: Emergency Medicine

## 2023-09-18 ENCOUNTER — Emergency Department (HOSPITAL_BASED_OUTPATIENT_CLINIC_OR_DEPARTMENT_OTHER)

## 2023-09-18 ENCOUNTER — Inpatient Hospital Stay (HOSPITAL_BASED_OUTPATIENT_CLINIC_OR_DEPARTMENT_OTHER)
Admission: EM | Admit: 2023-09-18 | Discharge: 2023-09-21 | DRG: 281 | Disposition: A | Discharge status: Home / Self Care | Attending: Family Medicine | Admitting: Family Medicine

## 2023-09-18 ENCOUNTER — Other Ambulatory Visit: Payer: Self-pay

## 2023-09-18 ENCOUNTER — Ambulatory Visit: Admission: EM | Admit: 2023-09-18 | Discharge: 2023-09-18 | Disposition: A | Discharge status: Home / Self Care

## 2023-09-18 DIAGNOSIS — R1010 Upper abdominal pain, unspecified: Secondary | ICD-10-CM

## 2023-09-18 DIAGNOSIS — N1831 Chronic kidney disease, stage 3a: Secondary | ICD-10-CM | POA: Diagnosis present

## 2023-09-18 DIAGNOSIS — E871 Hypo-osmolality and hyponatremia: Secondary | ICD-10-CM | POA: Insufficient documentation

## 2023-09-18 DIAGNOSIS — Z7984 Long term (current) use of oral hypoglycemic drugs: Secondary | ICD-10-CM

## 2023-09-18 DIAGNOSIS — R7401 Elevation of levels of liver transaminase levels: Secondary | ICD-10-CM | POA: Insufficient documentation

## 2023-09-18 DIAGNOSIS — E7800 Pure hypercholesterolemia, unspecified: Secondary | ICD-10-CM | POA: Diagnosis present

## 2023-09-18 DIAGNOSIS — E119 Type 2 diabetes mellitus without complications: Secondary | ICD-10-CM | POA: Diagnosis not present

## 2023-09-18 DIAGNOSIS — R531 Weakness: Secondary | ICD-10-CM

## 2023-09-18 DIAGNOSIS — N179 Acute kidney failure, unspecified: Secondary | ICD-10-CM | POA: Diagnosis not present

## 2023-09-18 DIAGNOSIS — I214 Non-ST elevation (NSTEMI) myocardial infarction: Principal | ICD-10-CM | POA: Diagnosis present

## 2023-09-18 DIAGNOSIS — R110 Nausea: Secondary | ICD-10-CM

## 2023-09-18 DIAGNOSIS — E785 Hyperlipidemia, unspecified: Secondary | ICD-10-CM | POA: Insufficient documentation

## 2023-09-18 DIAGNOSIS — I10 Essential (primary) hypertension: Secondary | ICD-10-CM | POA: Diagnosis present

## 2023-09-18 DIAGNOSIS — Z7982 Long term (current) use of aspirin: Secondary | ICD-10-CM

## 2023-09-18 DIAGNOSIS — I130 Hypertensive heart and chronic kidney disease with heart failure and stage 1 through stage 4 chronic kidney disease, or unspecified chronic kidney disease: Secondary | ICD-10-CM | POA: Diagnosis present

## 2023-09-18 DIAGNOSIS — I2510 Atherosclerotic heart disease of native coronary artery without angina pectoris: Secondary | ICD-10-CM | POA: Diagnosis present

## 2023-09-18 DIAGNOSIS — R1013 Epigastric pain: Secondary | ICD-10-CM | POA: Diagnosis not present

## 2023-09-18 DIAGNOSIS — E1122 Type 2 diabetes mellitus with diabetic chronic kidney disease: Secondary | ICD-10-CM | POA: Diagnosis present

## 2023-09-18 DIAGNOSIS — I5022 Chronic systolic (congestive) heart failure: Secondary | ICD-10-CM | POA: Insufficient documentation

## 2023-09-18 DIAGNOSIS — G4733 Obstructive sleep apnea (adult) (pediatric): Secondary | ICD-10-CM | POA: Diagnosis present

## 2023-09-18 DIAGNOSIS — I219 Acute myocardial infarction, unspecified: Secondary | ICD-10-CM

## 2023-09-18 DIAGNOSIS — Z87891 Personal history of nicotine dependence: Secondary | ICD-10-CM

## 2023-09-18 DIAGNOSIS — K838 Other specified diseases of biliary tract: Secondary | ICD-10-CM | POA: Diagnosis present

## 2023-09-18 DIAGNOSIS — K760 Fatty (change of) liver, not elsewhere classified: Secondary | ICD-10-CM | POA: Diagnosis present

## 2023-09-18 DIAGNOSIS — Z79899 Other long term (current) drug therapy: Secondary | ICD-10-CM

## 2023-09-18 DIAGNOSIS — Z888 Allergy status to other drugs, medicaments and biological substances status: Secondary | ICD-10-CM

## 2023-09-18 DIAGNOSIS — Z881 Allergy status to other antibiotic agents status: Secondary | ICD-10-CM

## 2023-09-18 DIAGNOSIS — I255 Ischemic cardiomyopathy: Secondary | ICD-10-CM | POA: Diagnosis present

## 2023-09-18 LAB — URINALYSIS, ROUTINE W REFLEX MICROSCOPIC
Bacteria, UA: NONE SEEN
Bilirubin Urine: NEGATIVE
Glucose, UA: NEGATIVE mg/dL
Leukocytes,Ua: NEGATIVE
Nitrite: NEGATIVE
Specific Gravity, Urine: 1.01 (ref 1.005–1.030)
pH: 5.5 (ref 5.0–8.0)

## 2023-09-18 LAB — CBC
HCT: 43.3 % (ref 39.0–52.0)
Hemoglobin: 15.3 g/dL (ref 13.0–17.0)
MCH: 32.5 pg (ref 26.0–34.0)
MCHC: 35.3 g/dL (ref 30.0–36.0)
MCV: 91.9 fL (ref 80.0–100.0)
Platelets: 202 10*3/uL (ref 150–400)
RBC: 4.71 MIL/uL (ref 4.22–5.81)
RDW: 12.1 % (ref 11.5–15.5)
WBC: 12 10*3/uL — ABNORMAL HIGH (ref 4.0–10.5)
nRBC: 0 % (ref 0.0–0.2)

## 2023-09-18 LAB — COMPREHENSIVE METABOLIC PANEL WITH GFR
ALT: 32 U/L (ref 0–44)
AST: 130 U/L — ABNORMAL HIGH (ref 15–41)
Albumin: 4.4 g/dL (ref 3.5–5.0)
Alkaline Phosphatase: 57 U/L (ref 38–126)
Anion gap: 10 (ref 5–15)
BUN: 18 mg/dL (ref 8–23)
CO2: 27 mmol/L (ref 22–32)
Calcium: 10.6 mg/dL — ABNORMAL HIGH (ref 8.9–10.3)
Chloride: 97 mmol/L — ABNORMAL LOW (ref 98–111)
Creatinine, Ser: 1.34 mg/dL — ABNORMAL HIGH (ref 0.61–1.24)
GFR, Estimated: 53 mL/min — ABNORMAL LOW (ref >60)
Glucose, Bld: 167 mg/dL — ABNORMAL HIGH (ref 70–99)
Potassium: 3.9 mmol/L (ref 3.5–5.1)
Sodium: 134 mmol/L — ABNORMAL LOW (ref 135–145)
Total Bilirubin: 1.4 mg/dL — ABNORMAL HIGH (ref 0.0–1.2)
Total Protein: 7.9 g/dL (ref 6.5–8.1)

## 2023-09-18 LAB — LIPASE, BLOOD: Lipase: 13 U/L (ref 11–51)

## 2023-09-18 LAB — TROPONIN I (HIGH SENSITIVITY)
Troponin I (High Sensitivity): 15946 ng/L (ref <18)
Troponin I (High Sensitivity): 20800 ng/L (ref <18)

## 2023-09-18 MED ORDER — HEPARIN BOLUS VIA INFUSION
4000.0000 [IU] | Freq: Once | INTRAVENOUS | Status: AC
Start: 2023-09-18 — End: 2023-09-18
  Administered 2023-09-18: 4000 [IU] via INTRAVENOUS

## 2023-09-18 MED ORDER — ASPIRIN 81 MG PO CHEW
324.0000 mg | CHEWABLE_TABLET | Freq: Once | ORAL | Status: AC
  Administered 2023-09-18: 324 mg via ORAL
  Filled 2023-09-18: qty 4

## 2023-09-18 MED ORDER — HEPARIN (PORCINE) 25000 UT/250ML-% IV SOLN
1100.0000 [IU]/h | INTRAVENOUS | Status: DC
Start: 2023-09-18 — End: 2023-09-19
  Administered 2023-09-18: 1000 [IU]/h via INTRAVENOUS
  Filled 2023-09-18: qty 250

## 2023-09-18 NOTE — ED Triage Notes (Signed)
 Pt states 3 days ago he began having central upper abdominal pain that he intimally thought was indigestion.   He took tums, pepcid, pepto bismol at home which has not helped. Has not eaten today  Had 3 BM's Tuesday

## 2023-09-18 NOTE — ED Triage Notes (Signed)
 Pt POV from UC after reporting mid upper abd pain x3 days, thought it was indigestion, took pepcid with no improvement. Normal BM, afebrile. Advised to come to ED for CT.

## 2023-09-18 NOTE — ED Notes (Signed)
 X-ray at bedside

## 2023-09-18 NOTE — ED Provider Notes (Signed)
 [facility]    CSN: [id number] Arrival date & time: 09/18/23  1552      History   Chief Complaint Chief Complaint  Patient presents with   Abdominal Cramping    HPI Richard Mclaughlin is a 83 y.o. male.   Richard Mclaughlin is a 83 y.o. male presenting for chief complaint of Abdominal Cramping that started upon waking 3 days ago. Pain is localized to the central upper abdomen and radiates to the left upper quadrant. He had 3 bowel movements the day that symptoms began but he does not feel like he fully emptied his bowels. He has not had bowel movement in the last 48 hours, however he states he has not eaten much and has had a decreased appetite. He has not eaten today and he feels generally weak today. Reports intermittent nausea without vomiting. Denies diarrhea, vomiting, flank pain, urinary symptoms, dizziness, viral URI symptoms, fever, chills, and body aches. History of ureteral stone, this does not feel similar. Denies history of abdominal surgeries. Admits to drinking one chocolate beer per night for the last several months, otherwise no heavy alcohol intake. He has been taking tums, peptobismol, and pepcid for the last few days without relief.    Abdominal Cramping    Past Medical History:  Diagnosis Date   High cholesterol     There are no active problems to display for this patient.   History reviewed. No pertinent surgical history.     Home Medications    Prior to Admission medications   Medication Sig Start Date End Date Taking? Authorizing Provider  aspirin EC 81 MG tablet Take 81 mg by mouth daily.    [provider]  cyanocobalamin (,VITAMIN B-12,) 1000 MCG/ML injection Inject 1,000 mcg into the muscle every 30 (thirty) days.     [provider]  EPINEPHrine (EPIPEN) 0.3 mg/0.3 mL SOAJ injection Inject 0.3 mL (0.3 mg total) into the muscle as needed. 01/14/13   Knapp, Iva, MD  Multiple Vitamin (MULTIVITAMIN WITH MINERALS)  TABS tablet Take 1 tablet by mouth daily.    [provider]  oxyCODONE-acetaminophen (PERCOCET/ROXICET) 5-325 MG tablet Take 1 tablet by mouth every 4 (four) hours as needed for severe pain. 10/02/15   Haviland, Julie, MD  pravastatin (PRAVACHOL) 10 MG tablet Take 10 mg by mouth every evening.    [provider]  tamsulosin (FLOMAX) 0.4 MG CAPS capsule Take 1 capsule (0.4 mg total) by mouth daily. 10/02/15   Haviland, Julie, MD  Vitamin D, Ergocalciferol, (DRISDOL) 50000 units CAPS capsule Take 50,000 Units by mouth every 14 (fourteen) days. 09/30/15   [provider]    Family History History reviewed. No pertinent family history.  Social History Social History   Tobacco Use   Smoking status: Former    Current packs/day: 0.00    Types: Cigarettes    Quit date: 06/04/1983    Years since quitting: 40.3  Substance Use Topics   Alcohol use: Yes   Drug use: No     Allergies   Azithromycin and Simvastatin   Review of Systems Review of Systems Per HPI  Physical Exam Triage Vital Signs ED Triage Vitals  Encounter Vitals Group     BP 09/18/23 1559 117/79     Systolic BP Percentile --      Diastolic BP Percentile --      Pulse Rate 09/18/23 1559 100     Resp 09/18/23 1559 17     Temp 09/18/23 1559  99.6 F (37.6 C)     Temp Source 09/18/23 1559 Oral     SpO2 09/18/23 1559 93 %     Weight --      Height --      Head Circumference --      Peak Flow --      Pain Score 09/18/23 1605 2     Pain Loc --      Pain Education --      Exclude from Growth Chart --    No data found.  Updated Vital Signs BP 117/79 (BP Location: Right Arm)   Pulse 100   Temp 99.6 F (37.6 C) (Oral)   Resp 17   SpO2 93%   Visual Acuity Right Eye Distance:   Left Eye Distance:   Bilateral Distance:    Right Eye Near:   Left Eye Near:    Bilateral Near:     Physical Exam Vitals and nursing note reviewed.  Constitutional:      Appearance: He is not ill-appearing or  toxic-appearing.  HENT:     Head: Normocephalic and atraumatic.     Right Ear: Hearing and external ear normal.     Left Ear: Hearing and external ear normal.     Nose: Nose normal.     Mouth/Throat:     Lips: Pink.  Eyes:     General: Lids are normal. Vision grossly intact. Gaze aligned appropriately.     Extraocular Movements: Extraocular movements intact.     Conjunctiva/sclera: Conjunctivae normal.  Cardiovascular:     Rate and Rhythm: Normal rate and regular rhythm.     Heart sounds: Normal heart sounds, S1 normal and S2 normal.  Pulmonary:     Effort: Pulmonary effort is normal. No respiratory distress.     Breath sounds: Normal breath sounds and air entry.  Abdominal:     General: Abdomen is flat. Bowel sounds are increased.     Palpations: Abdomen is soft. There is no shifting dullness, fluid wave, hepatomegaly, splenomegaly, mass or pulsatile mass.     Tenderness: There is abdominal tenderness. There is guarding. There is no right CVA tenderness, left CVA tenderness or rebound. Positive signs include Murphy's sign. Negative signs include McBurney's sign.  Musculoskeletal:     Cervical back: Neck supple.  Skin:    General: Skin is warm and dry.     Capillary Refill: Capillary refill takes less than 2 seconds.     Findings: No rash.  Neurological:     General: No focal deficit present.     Mental Status: He is alert and oriented to person, place, and time. Mental status is at baseline.     Cranial Nerves: No dysarthria or facial asymmetry.  Psychiatric:        Mood and Affect: Mood normal.        Speech: Speech normal.        Behavior: Behavior normal.        Thought Content: Thought content normal.        Judgment: Judgment normal.      UC Treatments / Results  Labs (all labs ordered are listed, but only abnormal results are displayed) Labs Reviewed - No data to display  EKG   Radiology No results found.  Procedures Procedures (including critical care  time)  Medications Ordered in UC Medications - No data to display  Initial Impression / Assessment and Plan / UC Course  I have reviewed the triage vital signs and the  nursing notes.  Pertinent labs & imaging results that were available during my care of the patient were reviewed by me and considered in my medical decision making (see chart for details).   1. Pain of upper abdomen Unclear cause of abdominal pain. Differential includes cholecystitis, pancreatitis, gastritis, PUD, colitis, constipation, etc.  Positive murphy's sign on exam with guarding. Low grade fever in triage at 99.6, pulse rate borderline tachycardic at 100 likely secondary to low grad fever and mild dehydration.  I would like for him to proceed to the nearest ER for further workup and evaluation due to concern for acute intraabdominal pathology that will require stat blood work and possible imaging that we do not have here in the urgent care setting.  Discussed clinical concerns/exam findings leading to recommendation for further workup in the ER setting and risks of deferring ER visit with patient/family. Patient/family express understanding and agreement with plan, discharged to ER via private car.   Final Clinical Impressions(s) / UC Diagnoses   Final diagnoses:  Pain of upper abdomen  Nausea without vomiting  Generalized weakness   Discharge Instructions   None    ED Prescriptions   None    PDMP not reviewed this encounter.   Starlene Eaton, [place] 09/18/23 [phone]

## 2023-09-18 NOTE — Progress Notes (Signed)
 PHARMACY - ANTICOAGULATION CONSULT NOTE  Pharmacy Consult for heparin Indication: chest pain/ACS  Allergies  Allergen Reactions   Azithromycin Other (See Comments)    Hemolytic anemia   Simvastatin     Other reaction(s): Muscle Pain    Patient Measurements: Height: 6\' 1"  (185.4 cm) Weight: 85.7 kg (189 lb) IBW/kg (Calculated) : 79.9 HEPARIN DW (KG): 85.7  Vital Signs: Temp: 98.4 F (36.9 C) (04/17 1727) Temp Source: Oral (04/17 1727) BP: 142/86 (04/17 1727) Pulse Rate: 100 (04/17 1727)  Labs: Recent Labs    09/18/23 1736  HGB 15.3  HCT 43.3  PLT 202  CREATININE 1.34*  TROPONINIHS 15,946*    Estimated Creatinine Clearance: 48 mL/min (A) (by C-G formula based on SCr of 1.34 mg/dL (H)).   Medical History: Past Medical History:  Diagnosis Date   High cholesterol     Medications:  (Not in a hospital admission)  Scheduled:   heparin  4,000 Units Intravenous Once   Infusions:   heparin     PRN:   Assessment: Patient presented to ED with chest / epigastric pain. Troponin >15K with EKG concerning for inferior MI.   Patient is not on anticoagulation prior to admission. Hb 15.3, plt 202.   Goal of Therapy:  Heparin level 0.3-0.7 units/ml Monitor platelets by anticoagulation protocol: Yes   Plan:  Give 4000 units bolus x 1 Start heparin infusion at 1000 units/hr Check anti-Xa level in 8 hours and daily while on heparin Continue to monitor H&H and platelets  Richard Mclaughlin 09/18/2023,6:47 PM

## 2023-09-18 NOTE — ED Notes (Signed)
 ED Provider at bedside.

## 2023-09-18 NOTE — ED Provider Notes (Addendum)
 [place] EMERGENCY DEPARTMENT AT [facility] Provider Note   CSN: [id number] Arrival date & time: 09/18/23  1717     History  Chief Complaint  Patient presents with   Abdominal Pain    [name] is a 83 y.o. male.  Patient with complaint of epigastric abdominal pain for Richard past 3 days.  Was worse yesterday Richard Richard day before it improved today but not resolved.  Richard pain has been constant never is gone completely away.  Not associated with nausea vomiting.  Patient never had any pain like this before.  Patient was at urgent care facility prior Richard sent here for additional evaluation.  Past medical history is never high cholesterol.  Former smoker quit 1985.  Patient never had any chest pain or any back pain.       Home Medications Prior to Admission medications   Medication Sig Start Date End Date Taking? Authorizing Provider  aspirin EC 81 MG tablet Take 81 mg by mouth daily.    [provider]  cyanocobalamin (,VITAMIN B-12,) 1000 MCG/ML injection Inject 1,000 mcg into Richard muscle every 30 (thirty) days.     [provider]  EPINEPHrine (EPIPEN) 0.3 mg/0.3 mL SOAJ injection Inject 0.3 mL (0.3 mg total) into Richard muscle as needed. 01/14/13   Knapp, Iva, MD  Multiple Vitamin (MULTIVITAMIN WITH MINERALS) TABS tablet Take 1 tablet by mouth daily.    [provider]  oxyCODONE-acetaminophen (PERCOCET/ROXICET) 5-325 MG tablet Take 1 tablet by mouth every 4 (four) hours as needed for severe pain. 10/02/15   Haviland, Julie, MD  pravastatin (PRAVACHOL) 10 MG tablet Take 10 mg by mouth every evening.    [provider]  tamsulosin (FLOMAX) 0.4 MG CAPS capsule Take 1 capsule (0.4 mg total) by mouth daily. 10/02/15   Haviland, Julie, MD  Vitamin D, Ergocalciferol, (DRISDOL) 50000 units CAPS capsule Take 50,000 Units by mouth every 14 (fourteen) days. 09/30/15   [provider]      Allergies    Azithromycin Richard Simvastatin     Review of Systems   Review of Systems  Constitutional:  Negative for chills Richard fever.  HENT:  Negative for ear pain Richard sore throat.   Eyes:  Negative for pain Richard visual disturbance.  Respiratory:  Negative for cough Richard shortness of breath.   Cardiovascular:  Negative for chest pain Richard palpitations.  Gastrointestinal:  Positive for abdominal pain. Negative for vomiting.  Genitourinary:  Negative for dysuria Richard hematuria.  Musculoskeletal:  Negative for arthralgias Richard back pain.  Skin:  Negative for color change Richard rash.  Neurological:  Negative for seizures Richard syncope.  All other systems reviewed Richard are negative.   Physical Exam Updated Vital Signs BP (!) 142/86 (BP Location: Right Arm)   Pulse 100   Temp 98.4 F (36.9 C) (Oral)   Resp 15   Ht 1.854 m (6\' 1" )   Wt 85.7 kg   SpO2 95%   BMI 24.94 kg/m  Physical Exam Vitals Richard nursing note reviewed.  Constitutional:      General: He is not in acute distress.    Appearance: Normal appearance. He is well-developed.  HENT:     Head: Normocephalic Richard atraumatic.  Eyes:     Extraocular Movements: Extraocular movements intact.     Conjunctiva/sclera: Conjunctivae normal.     Pupils: Pupils are equal, round, Richard reactive to light.  Cardiovascular:     Rate Richard Rhythm: Normal rate Richard regular rhythm.  Heart sounds: No murmur heard. Pulmonary:     Effort: Pulmonary effort is normal. No respiratory distress.     Breath sounds: Normal breath sounds. No wheezing, rhonchi or rales.  Abdominal:     General: There is no distension.     Palpations: Abdomen is soft.     Tenderness: There is no abdominal tenderness. There is no guarding.     Comments: Abdomen nontender  Musculoskeletal:        General: No swelling.     Cervical back: Neck supple.  Skin:    General: Skin is warm Richard dry.     Capillary Refill: Capillary refill takes less than 2 seconds.  Neurological:     General: No focal deficit present.      Mental Status: He is alert Richard oriented to person, place, Richard time.  Psychiatric:        Mood Richard Affect: Mood normal.     ED Results / Procedures / Treatments   Labs (all labs ordered are listed, but only abnormal results are displayed) Labs Reviewed  COMPREHENSIVE METABOLIC PANEL WITH GFR - Abnormal; Notable for Richard following components:      Result Value   Sodium 134 (*)    Chloride 97 (*)    Glucose, Bld 167 (*)    Creatinine, Ser 1.34 (*)    Calcium 10.6 (*)    AST 130 (*)    Total Bilirubin 1.4 (*)    GFR, Estimated 53 (*)    All other components within normal limits  CBC - Abnormal; Notable for Richard following components:   WBC 12.0 (*)    All other components within normal limits  TROPONIN I (HIGH SENSITIVITY) - Abnormal; Notable for Richard following components:   Troponin I (High Sensitivity) 15,946 (*)    All other components within normal limits  LIPASE, BLOOD  URINALYSIS, ROUTINE W REFLEX MICROSCOPIC    EKG EKG Interpretation Date/Time:  Thursday September 18 2023 17:46:38 EDT Ventricular Rate:  100 PR Interval:  156 QRS Duration:  98 QT Interval:  362 QTC Calculation: 466 R Axis:   19  Text Interpretation: Normal sinus rhythm Inferior-posterior infarct (cited on or before 18-Sep-2023) ST & T wave abnormality, consider lateral ischemia Consider right ventricular involvement in acute inferior infarct Abnormal ECG When compared with ECG of 18-Sep-2023 17:40, Premature ventricular complexes are no longer Present Confirmed by Lavoris Sparling [phone]) on 09/18/2023 6:25:58 PM  Radiology No results found.  Procedures Procedures    Medications Ordered in ED Medications  aspirin chewable tablet 324 mg (324 mg Oral Given 09/18/23 1813)    ED Course/ Medical Decision Making/ A&P                                 Medical Decision Making Amount Richard/or Complexity of Data Reviewed Labs: ordered. Radiology: ordered.  Risk OTC drugs. Prescription drug  management. Decision regarding hospitalization.   Patient's EKG concerning for inferior MI.  Would be consistent with something that occurred over Richard last couple days because there is Q waves in 3 Richard aVF.  There is also some fairly significant ST segment depression in V2.  Discussed with Dr. Peter [place]il[place] on-call for STEMI at [facility].  Based on Richard fact that pains improved today Richard it was worse Richard last 2 days.  Not ruling out Richard patient has not had an inferior MI event.  But feels that he does not  need to go to immediately to Richard Cath Lab.  Will get troponins.  Will check LFTs Richard upper abdominal labs.  Patient's lipase is 13 patient's complete metabolic panel total bili 1.4 GFR 53.  AST up at 130.  Alk phos is normal.  Glucose 167 sodium 134 chloride 97.  Creatinine up at 1.34.  Anion gap 10 CBC white count 12,000 hemoglobin 10.3 platelets 202.  Portable chest x-ray is pending Richard troponins are pending.  Patient's initial troponin is 15,900.  Consistent with myocardial infarction.  Patient already had cueing.  So he was not a candidate to go immediately to Richard Cath Lab.  To give patient aspirin.  Will start heparin.  Because of his LFTs being a little bit abnormal with Richard total bili at 1.4 Richard he said he did have epigastric tenderness.  Although lipase is normal.  Richard white count up a little bit at 12,000.  Will go ahead Richard get right upper quadrant ultrasound as well.  Will discuss with on-call cardiology.  CRITICAL CARE Performed by: Tabrina Esty Total critical care time: 45 minutes Critical care time was exclusive of separately billable procedures Richard treating other patients. Critical care was necessary to treat or prevent imminent or life-threatening deterioration. Critical care was time spent personally by me on Richard following activities: development of treatment plan with patient Richard/or surrogate as well as nursing, discussions with consultants, evaluation of patient's response to  treatment, examination of patient, obtaining history from patient or surrogate, ordering Richard performing treatments Richard interventions, ordering Richard review of laboratory studies, ordering Richard review of radiographic studies, pulse oximetry Richard re-evaluation of patient's condition.   Discussed with regular cardiology.  They are recommending hospitalist admission because of concerns for Richard right upper quadrant epigastric pain not being totally due to Richard MI.  Due to some minor changes in LFTs.  Ultrasound right upper quadrant is ordered Richard pending.  Will discuss with hospitalist.  Patient does had baby aspirin.  Richard have ordered heparin.  Discussed with hospitalist they will admit.  Patient's ultrasound is back.  Showed no evidence of any gallbladder problems but Richard common bile duct was 8 mm.  They make Richard comment that this could be normal limits for this patient given his age.  There was no Coley cystolithiasis or changes of acute cholecystitis.     Final Clinical Impression(s) / ED Diagnoses Final diagnoses:  Epigastric pain  Acute myocardial infarction, unspecified MI type, unspecified artery [facility])    Rx / DC Orders ED Discharge Orders     None         Nicklas Barns, MD 09/18/23 1819    Nicklas Barns, MD 09/18/23 1844    Nicklas Barns, MD 09/18/23 1901    Nicklas Barns, MD 09/18/23 2002

## 2023-09-18 NOTE — ED Notes (Signed)
 [place] at bedside

## 2023-09-18 NOTE — ED Notes (Signed)
 Patient is being discharged from the Urgent Care and sent to the Emergency Department via POV . Per Elnita Hai, FNP, patient is in need of higher level of care due to Abdominal Pain. Patient is aware and verbalizes understanding of plan of care.  Vitals:   09/18/23 1559  BP: 117/79  Pulse: 100  Resp: 17  Temp: 99.6 F (37.6 C)  SpO2: 93%

## 2023-09-18 NOTE — ED Notes (Signed)
 Paged Dr. Sw[place]il[place], cardio [phone]

## 2023-09-19 ENCOUNTER — Inpatient Hospital Stay (HOSPITAL_COMMUNITY)

## 2023-09-19 ENCOUNTER — Encounter (HOSPITAL_COMMUNITY)
Admission: EM | Disposition: A | Discharge status: Home / Self Care | Payer: Self-pay | Source: Home / Self Care | Attending: Internal Medicine

## 2023-09-19 ENCOUNTER — Encounter (HOSPITAL_COMMUNITY): Payer: Self-pay | Admitting: Family Medicine

## 2023-09-19 DIAGNOSIS — I5022 Chronic systolic (congestive) heart failure: Secondary | ICD-10-CM | POA: Insufficient documentation

## 2023-09-19 DIAGNOSIS — G4733 Obstructive sleep apnea (adult) (pediatric): Secondary | ICD-10-CM | POA: Diagnosis present

## 2023-09-19 DIAGNOSIS — K760 Fatty (change of) liver, not elsewhere classified: Secondary | ICD-10-CM | POA: Diagnosis present

## 2023-09-19 DIAGNOSIS — I10 Essential (primary) hypertension: Secondary | ICD-10-CM | POA: Diagnosis present

## 2023-09-19 DIAGNOSIS — I214 Non-ST elevation (NSTEMI) myocardial infarction: Secondary | ICD-10-CM

## 2023-09-19 DIAGNOSIS — I2510 Atherosclerotic heart disease of native coronary artery without angina pectoris: Secondary | ICD-10-CM | POA: Diagnosis present

## 2023-09-19 DIAGNOSIS — E871 Hypo-osmolality and hyponatremia: Secondary | ICD-10-CM | POA: Insufficient documentation

## 2023-09-19 DIAGNOSIS — I2119 ST elevation (STEMI) myocardial infarction involving other coronary artery of inferior wall: Secondary | ICD-10-CM | POA: Diagnosis not present

## 2023-09-19 DIAGNOSIS — Z7984 Long term (current) use of oral hypoglycemic drugs: Secondary | ICD-10-CM | POA: Diagnosis not present

## 2023-09-19 DIAGNOSIS — E7800 Pure hypercholesterolemia, unspecified: Secondary | ICD-10-CM | POA: Diagnosis present

## 2023-09-19 DIAGNOSIS — E7849 Other hyperlipidemia: Secondary | ICD-10-CM | POA: Diagnosis not present

## 2023-09-19 DIAGNOSIS — I213 ST elevation (STEMI) myocardial infarction of unspecified site: Secondary | ICD-10-CM | POA: Diagnosis not present

## 2023-09-19 DIAGNOSIS — N179 Acute kidney failure, unspecified: Secondary | ICD-10-CM | POA: Diagnosis not present

## 2023-09-19 DIAGNOSIS — Z79899 Other long term (current) drug therapy: Secondary | ICD-10-CM | POA: Diagnosis not present

## 2023-09-19 DIAGNOSIS — R7401 Elevation of levels of liver transaminase levels: Secondary | ICD-10-CM | POA: Insufficient documentation

## 2023-09-19 DIAGNOSIS — K838 Other specified diseases of biliary tract: Secondary | ICD-10-CM | POA: Diagnosis present

## 2023-09-19 DIAGNOSIS — E1122 Type 2 diabetes mellitus with diabetic chronic kidney disease: Secondary | ICD-10-CM | POA: Diagnosis present

## 2023-09-19 DIAGNOSIS — I255 Ischemic cardiomyopathy: Secondary | ICD-10-CM | POA: Diagnosis present

## 2023-09-19 DIAGNOSIS — I130 Hypertensive heart and chronic kidney disease with heart failure and stage 1 through stage 4 chronic kidney disease, or unspecified chronic kidney disease: Secondary | ICD-10-CM | POA: Diagnosis present

## 2023-09-19 DIAGNOSIS — R1013 Epigastric pain: Secondary | ICD-10-CM | POA: Diagnosis present

## 2023-09-19 DIAGNOSIS — Z7982 Long term (current) use of aspirin: Secondary | ICD-10-CM | POA: Diagnosis not present

## 2023-09-19 DIAGNOSIS — E785 Hyperlipidemia, unspecified: Secondary | ICD-10-CM | POA: Insufficient documentation

## 2023-09-19 DIAGNOSIS — E119 Type 2 diabetes mellitus without complications: Secondary | ICD-10-CM | POA: Diagnosis not present

## 2023-09-19 DIAGNOSIS — Z888 Allergy status to other drugs, medicaments and biological substances status: Secondary | ICD-10-CM | POA: Diagnosis not present

## 2023-09-19 DIAGNOSIS — N1831 Chronic kidney disease, stage 3a: Secondary | ICD-10-CM | POA: Diagnosis present

## 2023-09-19 DIAGNOSIS — Z881 Allergy status to other antibiotic agents status: Secondary | ICD-10-CM | POA: Diagnosis not present

## 2023-09-19 DIAGNOSIS — Z87891 Personal history of nicotine dependence: Secondary | ICD-10-CM | POA: Diagnosis not present

## 2023-09-19 HISTORY — DX: Type 2 diabetes mellitus without complications: E11.9

## 2023-09-19 HISTORY — DX: Essential (primary) hypertension: I10

## 2023-09-19 HISTORY — DX: Chronic kidney disease, stage 3a: N18.31

## 2023-09-19 HISTORY — DX: Obstructive sleep apnea (adult) (pediatric): G47.33

## 2023-09-19 LAB — TROPONIN I (HIGH SENSITIVITY): Troponin I (High Sensitivity): 15283 ng/L (ref <18)

## 2023-09-19 LAB — ECHOCARDIOGRAM COMPLETE
Area-P 1/2: 2.55 cm2
Calc EF: 45.3 %
Est EF: 25
Height: 73 in
S' Lateral: 3.5 cm
Single Plane A2C EF: 41.8 %
Single Plane A4C EF: 43.7 %
Weight: 3044.11 [oz_av]

## 2023-09-19 LAB — CBC
HCT: 41 % (ref 39.0–52.0)
Hemoglobin: 14.4 g/dL (ref 13.0–17.0)
MCH: 32 pg (ref 26.0–34.0)
MCHC: 35.1 g/dL (ref 30.0–36.0)
MCV: 91.1 fL (ref 80.0–100.0)
Platelets: 187 10*3/uL (ref 150–400)
RBC: 4.5 MIL/uL (ref 4.22–5.81)
RDW: 12.2 % (ref 11.5–15.5)
WBC: 10.6 10*3/uL — ABNORMAL HIGH (ref 4.0–10.5)
nRBC: 0 % (ref 0.0–0.2)

## 2023-09-19 LAB — BASIC METABOLIC PANEL WITH GFR
Anion gap: 10 (ref 5–15)
BUN: 17 mg/dL (ref 8–23)
CO2: 22 mmol/L (ref 22–32)
Calcium: 9.6 mg/dL (ref 8.9–10.3)
Chloride: 102 mmol/L (ref 98–111)
Creatinine, Ser: 1.18 mg/dL (ref 0.61–1.24)
GFR, Estimated: >60 mL/min (ref >60)
Glucose, Bld: 152 mg/dL — ABNORMAL HIGH (ref 70–99)
Potassium: 3.7 mmol/L (ref 3.5–5.1)
Sodium: 134 mmol/L — ABNORMAL LOW (ref 135–145)

## 2023-09-19 LAB — HEPATIC FUNCTION PANEL
ALT: 30 U/L (ref 0–44)
AST: 105 U/L — ABNORMAL HIGH (ref 15–41)
Albumin: 3.4 g/dL — ABNORMAL LOW (ref 3.5–5.0)
Alkaline Phosphatase: 48 U/L (ref 38–126)
Bilirubin, Direct: 0.3 mg/dL — ABNORMAL HIGH (ref 0.0–0.2)
Indirect Bilirubin: 1.1 mg/dL — ABNORMAL HIGH (ref 0.3–0.9)
Total Bilirubin: 1.4 mg/dL — ABNORMAL HIGH (ref 0.0–1.2)
Total Protein: 7.1 g/dL (ref 6.5–8.1)

## 2023-09-19 LAB — HEPARIN LEVEL (UNFRACTIONATED): Heparin Unfractionated: 0.19 [IU]/mL — ABNORMAL LOW (ref 0.30–0.70)

## 2023-09-19 LAB — GLUCOSE, CAPILLARY
Glucose-Capillary: 162 mg/dL — ABNORMAL HIGH (ref 70–99)
Glucose-Capillary: 190 mg/dL — ABNORMAL HIGH (ref 70–99)
Glucose-Capillary: 156 mg/dL — ABNORMAL HIGH (ref 70–99)
Glucose-Capillary: 144 mg/dL — ABNORMAL HIGH (ref 70–99)

## 2023-09-19 LAB — HEMOGLOBIN A1C
Hgb A1c MFr Bld: 6.4 % — ABNORMAL HIGH (ref 4.8–5.6)
Mean Plasma Glucose: 136.98 mg/dL

## 2023-09-19 LAB — MAGNESIUM: Magnesium: 2 mg/dL (ref 1.7–2.4)

## 2023-09-19 SURGERY — LEFT HEART CATH AND CORONARY ANGIOGRAPHY
Anesthesia: LOCAL

## 2023-09-19 MED ORDER — HEPARIN SODIUM (PORCINE) 1000 UNIT/ML IJ SOLN
INTRAMUSCULAR | Status: AC
  Filled 2023-09-19: qty 10

## 2023-09-19 MED ORDER — EMPAGLIFLOZIN 10 MG PO TABS
10.0000 mg | ORAL_TABLET | Freq: Every day | ORAL | Status: DC
  Administered 2023-09-19 – 2023-09-20 (×2): 10 mg via ORAL
  Filled 2023-09-19 (×2): qty 1

## 2023-09-19 MED ORDER — SODIUM CHLORIDE 0.9 % IV SOLN: 250.0000 mL | INTRAVENOUS | Status: DC | PRN

## 2023-09-19 MED ORDER — ACETAMINOPHEN 325 MG PO TABS: 650.0000 mg | ORAL_TABLET | ORAL | Status: DC | PRN

## 2023-09-19 MED ORDER — OXYCODONE HCL 5 MG PO TABS: 5.0000 mg | ORAL_TABLET | ORAL | Status: DC | PRN

## 2023-09-19 MED ORDER — HEPARIN SODIUM (PORCINE) 1000 UNIT/ML IJ SOLN
INTRAMUSCULAR | Status: DC | PRN
  Administered 2023-09-19: 5000 [IU] via INTRAVENOUS

## 2023-09-19 MED ORDER — METOPROLOL TARTRATE 25 MG PO TABS
25.0000 mg | ORAL_TABLET | Freq: Two times a day (BID) | ORAL | Status: DC
  Administered 2023-09-19: 25 mg via ORAL
  Filled 2023-09-19: qty 1

## 2023-09-19 MED ORDER — ASPIRIN 81 MG PO TBEC
81.0000 mg | DELAYED_RELEASE_TABLET | Freq: Every day | ORAL | Status: DC
  Administered 2023-09-20 – 2023-09-21 (×2): 81 mg via ORAL
  Filled 2023-09-19 (×2): qty 1

## 2023-09-19 MED ORDER — SACUBITRIL-VALSARTAN 24-26 MG PO TABS
1.0000 | ORAL_TABLET | Freq: Two times a day (BID) | ORAL | Status: DC
  Administered 2023-09-19 – 2023-09-20 (×2): 1 via ORAL
  Filled 2023-09-19 (×3): qty 1

## 2023-09-19 MED ORDER — NITROGLYCERIN 0.4 MG SL SUBL: 0.4000 mg | SUBLINGUAL_TABLET | SUBLINGUAL | Status: DC | PRN

## 2023-09-19 MED ORDER — LIDOCAINE HCL (PF) 1 % IJ SOLN
INTRAMUSCULAR | Status: DC | PRN
  Administered 2023-09-19: 2 mL

## 2023-09-19 MED ORDER — MIDAZOLAM HCL 2 MG/2ML IJ SOLN
INTRAMUSCULAR | Status: AC
  Filled 2023-09-19: qty 2

## 2023-09-19 MED ORDER — HEPARIN BOLUS VIA INFUSION
2000.0000 [IU] | Freq: Once | INTRAVENOUS | Status: AC
  Administered 2023-09-19: 2000 [IU] via INTRAVENOUS
  Filled 2023-09-19: qty 2000

## 2023-09-19 MED ORDER — ONDANSETRON HCL 4 MG/2ML IJ SOLN: 4.0000 mg | Freq: Four times a day (QID) | INTRAMUSCULAR | Status: DC | PRN

## 2023-09-19 MED ORDER — SODIUM CHLORIDE 0.9 % IV SOLN: INTRAVENOUS | Status: DC

## 2023-09-19 MED ORDER — PERFLUTREN LIPID MICROSPHERE
1.0000 mL | INTRAVENOUS | Status: DC | PRN
  Administered 2023-09-19: 3 mL via INTRAVENOUS

## 2023-09-19 MED ORDER — ATORVASTATIN CALCIUM 80 MG PO TABS
80.0000 mg | ORAL_TABLET | Freq: Every day | ORAL | Status: DC
  Administered 2023-09-19 – 2023-09-21 (×3): 80 mg via ORAL
  Filled 2023-09-19 (×3): qty 1

## 2023-09-19 MED ORDER — VERAPAMIL HCL 2.5 MG/ML IV SOLN
INTRAVENOUS | Status: DC | PRN
  Administered 2023-09-19: 10 mL via INTRA_ARTERIAL

## 2023-09-19 MED ORDER — CARVEDILOL 3.125 MG PO TABS
3.1250 mg | ORAL_TABLET | Freq: Two times a day (BID) | ORAL | Status: DC
  Administered 2023-09-19 – 2023-09-21 (×4): 3.125 mg via ORAL
  Filled 2023-09-19 (×4): qty 1

## 2023-09-19 MED ORDER — FENTANYL CITRATE (PF) 100 MCG/2ML IJ SOLN
INTRAMUSCULAR | Status: AC
  Filled 2023-09-19: qty 2

## 2023-09-19 MED ORDER — FENTANYL CITRATE (PF) 100 MCG/2ML IJ SOLN
INTRAMUSCULAR | Status: DC | PRN
  Administered 2023-09-19: 25 ug via INTRAVENOUS

## 2023-09-19 MED ORDER — VERAPAMIL HCL 2.5 MG/ML IV SOLN
INTRAVENOUS | Status: AC
  Filled 2023-09-19: qty 2

## 2023-09-19 MED ORDER — IOHEXOL 350 MG/ML SOLN
INTRAVENOUS | Status: DC | PRN
  Administered 2023-09-19: 35 mL

## 2023-09-19 MED ORDER — CLOPIDOGREL BISULFATE 75 MG PO TABS
75.0000 mg | ORAL_TABLET | Freq: Every day | ORAL | Status: DC
  Administered 2023-09-19 – 2023-09-21 (×3): 75 mg via ORAL
  Filled 2023-09-19 (×3): qty 1

## 2023-09-19 MED ORDER — LIDOCAINE HCL (PF) 1 % IJ SOLN
INTRAMUSCULAR | Status: AC
  Filled 2023-09-19: qty 30

## 2023-09-19 MED ORDER — INSULIN ASPART 100 UNIT/ML IJ SOLN
0.0000 [IU] | INTRAMUSCULAR | Status: DC
  Administered 2023-09-19 – 2023-09-20 (×5): 1 [IU] via SUBCUTANEOUS

## 2023-09-19 MED ORDER — ASPIRIN 81 MG PO CHEW
81.0000 mg | CHEWABLE_TABLET | ORAL | Status: AC
  Administered 2023-09-19: 81 mg via ORAL
  Filled 2023-09-19: qty 1

## 2023-09-19 MED ORDER — HEPARIN (PORCINE) IN NACL 1000-0.9 UT/500ML-% IV SOLN
INTRAVENOUS | Status: DC | PRN
  Administered 2023-09-19: 1000 mL

## 2023-09-19 MED ORDER — SODIUM CHLORIDE 0.9% FLUSH: 3.0000 mL | INTRAVENOUS | Status: DC | PRN

## 2023-09-19 MED ORDER — MIDAZOLAM HCL 2 MG/2ML IJ SOLN
INTRAMUSCULAR | Status: DC | PRN
  Administered 2023-09-19: 2 mg via INTRAVENOUS

## 2023-09-19 SURGICAL SUPPLY — 8 items
CATH INFINITI 5FR ANG PIGTAIL (CATHETERS) IMPLANT
CATH INFINITI AMBI 5FR TG (CATHETERS) IMPLANT
DEVICE RAD COMP TR BAND LRG (VASCULAR PRODUCTS) IMPLANT
GLIDESHEATH SLEND A-KIT 6F 22G (SHEATH) IMPLANT
GUIDEWIRE INQWIRE 1.5J.035X260 (WIRE) IMPLANT
INQWIRE 1.5J .035X260CM (WIRE) ×1 IMPLANT
PACK CARDIAC CATHETERIZATION (CUSTOM PROCEDURE TRAY) ×1 IMPLANT
SET ATX-X65L (MISCELLANEOUS) IMPLANT

## 2023-09-19 NOTE — Assessment & Plan Note (Signed)
 -  CPAP at night

## 2023-09-19 NOTE — Interval H&P Note (Signed)
 History and Physical Interval Note:  09/19/2023 10:05 AM  Richard Mclaughlin  has presented today for surgery, with the diagnosis of chest pain.  The various methods of treatment have been discussed with the patient and family. After consideration of risks, benefits and other options for treatment, the patient has consented to  Procedure(s): LEFT HEART CATH AND CORONARY ANGIOGRAPHY (N/A) and possible coronary angioplasty for ACS as a surgical intervention.  The patient's history has been reviewed, patient examined, no change in status, stable for surgery.  I have reviewed the patient's chart and labs.  Questions were answered to the patient's satisfaction.     Knox Perl

## 2023-09-19 NOTE — H&P (Signed)
 History and Physical    Richard Mclaughlin ZOX:[id number] DOB: 12-May-1941 DOA: 09/18/2023  PCP: Fannie Homestead, MD   Patient coming from: Home   Chief Complaint: Abdominal pain   HPI: Richard Mclaughlin is a 83 y.o. male with medical history significant for hypertension, hyperlipidemia with statin intolerance, type 2 diabetes mellitus, CKD 3A, and OSA on CPAP who presents with abdominal pain.  Patient reports developing upper abdominal pain 3 days ago.  He describes this as severe, sharp, radiating to his back, and without any alleviating or exacerbating factors identified.  This pain began to ease off this morning and has resolved by time of admission.  When specifically asked, he also acknowledges occasional dull and vague discomfort in the left chest.  He had a little bit of nausea with this but no vomiting, shortness of breath, or diaphoresis.  [facility] ED Course: Upon arrival to the ED, patient is found to be afebrile and saturating mid 90s on room air with normal heart rate and stable blood pressure.  Labs are notable for creatinine 1.34, WBC 12,400, lipase 13, and troponin 15,946.  EKG reveals a sinus rhythm with ST and T wave abnormality.  No acute findings on chest x-ray.  Right upper quadrant ultrasound is negative for cholelithiasis or acute cholecystitis.  Cardiology was consulted by the ED physician, patient was started on IV heparin  and given 324 mg aspirin , and he was transferred to [facility] for admission.  Review of Systems:  All other systems reviewed and apart from HPI, are negative.  Past Medical History:  Diagnosis Date   CKD stage 3a, GFR 45-59 ml/min (HCC) 09/19/2023   High cholesterol    Hypertension 09/19/2023   Non-insulin  dependent type 2 diabetes mellitus (HCC) 09/19/2023   OSA (obstructive sleep apnea) 09/19/2023    History reviewed. No pertinent surgical history.  Social History:   reports that he quit smoking about 40 years ago.  He does not have any smokeless tobacco history on file. He reports current alcohol use. He reports that he does not use drugs.  Allergies  Allergen Reactions   Azithromycin Other (See Comments)    Hemolytic anemia   Simvastatin     Other reaction(s): Muscle Pain    History reviewed. No pertinent family history.   Prior to Admission medications   Medication Sig Start Date End Date Taking? Authorizing Provider  aspirin  EC 81 MG tablet Take 81 mg by mouth daily.    [provider]  cyanocobalamin (,VITAMIN B-12,) 1000 MCG/ML injection Inject 1,000 mcg into the muscle every 30 (thirty) days.     [provider]  EPINEPHrine  (EPIPEN ) 0.3 mg/0.3 mL SOAJ injection Inject 0.3 mL (0.3 mg total) into the muscle as needed. 01/14/13   Knapp, Iva, MD  Multiple Vitamin (MULTIVITAMIN WITH MINERALS) TABS tablet Take 1 tablet by mouth daily.    [provider]  oxyCODONE -acetaminophen  (PERCOCET/ROXICET) 5-325 MG tablet Take 1 tablet by mouth every 4 (four) hours as needed for severe pain. 10/02/15   Haviland, Julie, MD  pravastatin (PRAVACHOL) 10 MG tablet Take 10 mg by mouth every evening.    [provider]  tamsulosin  (FLOMAX ) 0.4 MG CAPS capsule Take 1 capsule (0.4 mg total) by mouth daily. 10/02/15   Haviland, Julie, MD  Vitamin D, Ergocalciferol, (DRISDOL) 50000 units CAPS capsule Take 50,000 Units by mouth every 14 (fourteen) days. 09/30/15   [provider]    Physical Exam: Vitals:   09/18/23 2215 09/18/23 2315 09/18/23  2330 09/19/23 0043  BP: (!) 140/79 (!) 145/80 (!) 141/80 132/60  Pulse: 80 82 82 87  Resp: 19 14 (!) 22 20  Temp:    99 F (37.2 C)  TempSrc:    Oral  SpO2: 95% 92% 91% 95%  Weight:   86.2 kg 86.3 kg  Height:   6\' 1"  (1.854 m)     Constitutional: NAD, no diaphoresis   Eyes: PERTLA, lids and conjunctivae normal ENMT: Mucous membranes are moist. Posterior pharynx clear of any exudate or lesions.   Neck: supple, no masses   Respiratory: no wheezing, no crackles. No accessory muscle use.  Cardiovascular: S1 & S2 heard, regular rate and rhythm. No extremity edema.  Abdomen: Soft, non-tender, no distension. Bowel sounds active.  Musculoskeletal: no clubbing / cyanosis. No joint deformity upper and lower extremities.   Skin: no significant rashes, lesions, ulcers. Warm, dry, well-perfused. Neurologic: CN 2-12 grossly intact. Moving all extremities. Alert and oriented.  Psychiatric: Pleasant. Cooperative.    Labs and Imaging on Admission: I have personally reviewed following labs and imaging studies  CBC: Recent Labs  Lab 09/18/23 1736 09/19/23 0253  WBC 12.0* 10.6*  HGB 15.3 14.4  HCT 43.3 41.0  MCV 91.9 91.1  PLT 202 187   Basic Metabolic Panel: Recent Labs  Lab 09/18/23 1736  NA 134*  K 3.9  CL 97*  CO2 27  GLUCOSE 167*  BUN 18  CREATININE 1.34*  CALCIUM  10.6*   GFR: Estimated Creatinine Clearance: 48 mL/min (A) (by C-G formula based on SCr of 1.34 mg/dL (H)). Liver Function Tests: Recent Labs  Lab 09/18/23 1736  AST 130*  ALT 32  ALKPHOS 57  BILITOT 1.4*  PROT 7.9  ALBUMIN 4.4   Recent Labs  Lab 09/18/23 1736  LIPASE 13   No results for input(s): "AMMONIA" in the last 168 hours. Coagulation Profile: No results for input(s): "INR", "PROTIME" in the last 168 hours. Cardiac Enzymes: No results for input(s): "CKTOTAL", "CKMB", "CKMBINDEX", "TROPONINI" in the last 168 hours. BNP (last 3 results) No results for input(s): "PROBNP" in the last 8760 hours. HbA1C: No results for input(s): "HGBA1C" in the last 72 hours. CBG: No results for input(s): "GLUCAP" in the last 168 hours. Lipid Profile: No results for input(s): "CHOL", "HDL", "LDLCALC", "TRIG", "CHOLHDL", "LDLDIRECT" in the last 72 hours. Thyroid Function Tests: No results for input(s): "TSH", "T4TOTAL", "FREET4", "T3FREE", "THYROIDAB" in the last 72 hours. Anemia Panel: No results for input(s): "VITAMINB12", "FOLATE",  "FERRITIN", "TIBC", "IRON", "RETICCTPCT" in the last 72 hours. Urine analysis:    Component Value Date/Time   COLORURINE YELLOW 09/18/2023 2241   APPEARANCEUR CLEAR 09/18/2023 2241   LABSPEC 1.010 09/18/2023 2241   PHURINE 5.5 09/18/2023 2241   GLUCOSEU NEGATIVE 09/18/2023 2241   HGBUR TRACE (A) 09/18/2023 2241   BILIRUBINUR NEGATIVE 09/18/2023 2241   KETONESUR TRACE (A) 09/18/2023 2241   PROTEINUR TRACE (A) 09/18/2023 2241   NITRITE NEGATIVE 09/18/2023 2241   LEUKOCYTESUR NEGATIVE 09/18/2023 2241   Sepsis Labs: @LABRCNTIP (procalcitonin:4,lacticidven:4) )No results found for this or any previous visit (from the past 240 hours).   Radiological Exams on Admission: US  Abdomen Limited Result Date: 09/18/2023 CLINICAL DATA:  Epigastric abdominal pain EXAM: ULTRASOUND ABDOMEN LIMITED RIGHT UPPER QUADRANT COMPARISON:  Oct 02, 2015 FINDINGS: Gallbladder: No gallstones or wall thickening visualized. No sonographic Murphy sign noted by sonographer. Common bile duct: Diameter: Suboptimally evaluated. Mildly dilated, partially visualized segment of the common bile duct measuring 8 mm. Liver: No focal lesion  identified. Within normal limits in parenchymal echogenicity. Portal vein is patent on color Doppler imaging with normal direction of blood flow towards the liver. Other: None. IMPRESSION: 1. No cholecystolithiasis or changes of acute cholecystitis. 2. A partially visualized, mildly dilated segment of the common bile duct is noted measuring 8 mm. This may be within normal limits for patient's given age. However, correlation with serum bilirubin is recommended. Electronically Signed   By: Rance Burrows M.D.   On: 09/18/2023 19:57   DG Chest Port 1 View Result Date: 09/18/2023 CLINICAL DATA:  Epigastric abdominal pain EXAM: PORTABLE CHEST 1 VIEW COMPARISON:  January 23, 2021 FINDINGS: No focal airspace consolidation, pleural effusion, or pneumothorax. No cardiomegaly. Tortuous aorta with aortic  atherosclerosis. No acute fracture or destructive lesions. Multilevel thoracic osteophytosis. IMPRESSION: No acute cardiopulmonary abnormality. Electronically Signed   By: Rance Burrows M.D.   On: 09/18/2023 19:54    EKG: Independently reviewed. Sinus rhythm, ST & T-wave abnormalities.   Assessment/Plan   1. NSTEMI - Continue IV heparin  and ASA, continue cardiac monitoring, trend troponin, follow-up on cardiology recommendations    2. Type II DM  - A1c was 6.6% in February 2025  - Check CBGs, use low-intensity SSI for now    3. CKD 3A  - Appears close to baseline - Renally-dose medications    4. OSA  - CPAP while sleeping    5. Abdominal pain - Resolved, exam benign    DVT prophylaxis: IV heparin   Code Status: Full  Level of Care: Level of care: Progressive Family Communication: None present  Disposition Plan:  Patient is from: Home  Anticipated d/c is to: Home  Anticipated d/c date is: 09/20/23  Patient currently: Pending cardiology consultation  Consults called: Cardiology  Admission status: Inpatient     Walton Guppy, MD [facility]  09/19/2023, 5:17 AM

## 2023-09-19 NOTE — Assessment & Plan Note (Addendum)
 Liver imaging shows fatty liver.  Abdominal discomfort is clearly exertional and an anginal equivalent.  No nausea or vomiting. Focal CBD dilation is nonspecific and does not correlate to clinical symptoms. - Trend LFTs after discharge

## 2023-09-19 NOTE — Progress Notes (Signed)
 PHARMACY - ANTICOAGULATION CONSULT NOTE  Pharmacy Consult for heparin  Indication: chest pain/ACS  Allergies  Allergen Reactions   Azithromycin Other (See Comments)    Hemolytic anemia   Simvastatin     Other reaction(s): Muscle Pain    Patient Measurements: Height: 6\' 1"  (185.4 cm) Weight: 86.3 kg (190 lb 4.1 oz) IBW/kg (Calculated) : 79.9 HEPARIN  DW (KG): 86.2  Vital Signs: Temp: 99 F (37.2 C) (04/18 0043) Temp Source: Oral (04/18 0043) BP: 132/60 (04/18 0043) Pulse Rate: 87 (04/18 0043)  Labs: Recent Labs    09/18/23 1736 09/18/23 2048 09/19/23 0253  HGB 15.3  --  14.4  HCT 43.3  --  41.0  PLT 202  --  187  HEPARINUNFRC  --   --  0.19*  CREATININE 1.34*  --   --   TROPONINIHS 40,981* 20,800*  --     Estimated Creatinine Clearance: 48 mL/min (A) (by C-G formula based on SCr of 1.34 mg/dL (H)).   Medical History: Past Medical History:  Diagnosis Date   CKD stage 3a, GFR 45-59 ml/min (HCC) 09/19/2023   High cholesterol    Hypertension 09/19/2023   Non-insulin  dependent type 2 diabetes mellitus (HCC) 09/19/2023   OSA (obstructive sleep apnea) 09/19/2023    Medications:  Medications Prior to Admission  Medication Sig Dispense Refill Last Dose/Taking   aspirin  EC 81 MG tablet Take 81 mg by mouth daily.      cyanocobalamin (,VITAMIN B-12,) 1000 MCG/ML injection Inject 1,000 mcg into the muscle every 30 (thirty) days.       EPINEPHrine  (EPIPEN ) 0.3 mg/0.3 mL SOAJ injection Inject 0.3 mL (0.3 mg total) into the muscle as needed. 2 Device 0    Multiple Vitamin (MULTIVITAMIN WITH MINERALS) TABS tablet Take 1 tablet by mouth daily.      oxyCODONE -acetaminophen  (PERCOCET/ROXICET) 5-325 MG tablet Take 1 tablet by mouth every 4 (four) hours as needed for severe pain. 20 tablet 0    pravastatin (PRAVACHOL) 10 MG tablet Take 10 mg by mouth every evening.      tamsulosin  (FLOMAX ) 0.4 MG CAPS capsule Take 1 capsule (0.4 mg total) by mouth daily. 30 capsule 0    Vitamin  D, Ergocalciferol, (DRISDOL) 50000 units CAPS capsule Take 50,000 Units by mouth every 14 (fourteen) days.      Scheduled:   heparin   2,000 Units Intravenous Once   Infusions:   heparin  1,000 Units/hr (09/18/23 1904)   PRN:   Assessment: Patient presented to ED with chest / epigastric pain. Troponin >15K with EKG concerning for inferior MI.   Patient is not on anticoagulation prior to admission. Hb 15.3, plt 202.   4/18 AM update:  Heparin  level sub-therapeutic   Goal of Therapy:  Heparin  level 0.3-0.7 units/ml Monitor platelets by anticoagulation protocol: Yes   Plan:  Heparin  2000 units re-bol[employer] heparin  to 1100 units/hr Heparin  level in 8 hours  Silvestre Drum, PharmD, BCPS Clinical Pharmacist Phone: [phone]

## 2023-09-19 NOTE — Telephone Encounter (Signed)
 LMTCB

## 2023-09-19 NOTE — Assessment & Plan Note (Addendum)
 History of statin intolerance. - Start Lipitor  80 nightly - Check LDL in 2 months and add non-statin agent if >70

## 2023-09-19 NOTE — Assessment & Plan Note (Addendum)
 Admitted on heparin  drip. Cardiology consulted.  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  OMT recommended.  LDL 170 last month Echo 4/18 showed reduced EF, 25%, also RWMA. Normal valves. - Continue new aspirin , Plavix  - Continue new Lipitor  - Continue new Entresto , Jardiance , Coreg  - Stop lisinopril

## 2023-09-19 NOTE — Progress Notes (Signed)
 Echocardiogram 2D Echocardiogram has been performed.  Emmaline Haring Denice Cardon RDCS 09/19/2023, 2:37 PM

## 2023-09-19 NOTE — Progress Notes (Signed)
  Progress Note   Patient: Richard Mclaughlin FMW:[id number] DOB: 1940-08-25 DOA: 09/18/2023     0 DOS: the patient was seen and examined on 09/19/2023 at 9:00 AM      Brief hospital course: 83 y.o. M with HTN, DM, HLD, CKD IIIa baseline 1.3 and OSA on CPAP who presented with several days exertional epigastric pain.  In the ER, ECG showed ST changes, troponin found to be >20K.  Admitted on heparin  gtt for NSTEMI.        Assessment and Plan: * Non-STEMI (non-ST elevated myocardial infarction) (HCC) Admitted on heparin  drip. Cardiology consulted.  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  OMT recommended.  LDL 170 last month - Continue new aspirin , Plavix  - Continue new Lipitor  - Entresto , Jardiance , Coreg  - Follow Echo    Chronic systolic CHF (congestive heart failure) (HCC) Ischemic cardiomyopathy Found to have reduced EF on LHC.  - Follow echo - Start Entresto , Coreg , Jardiance   Hyponatremia Mild, asymptomatic - Trend BMP post-cath  Transaminitis Liver imaging shows fatty liver.  Abdominal discomfort is clearly exertional and an anginal equivalent.  No nausea or vomiting. Focal CBD dilation is nonspecific and does not correlate to clinical symptoms. - Trend LFTs after discharge  Hyperlipidemia History of statin intolerance. - Start Lipitor  80 nightly - Check LDL in 2 months and add non-statin agent if >70  OSA (obstructive sleep apnea) - CPAP at night  Non-insulin  dependent type 2 diabetes mellitus (HCC) Glucose controlled.  Diet controlled at baseline. A1c 6.4% - Continue SS correction insulin   Hypertension BP soft - Hold lisinopril - Continue new Coreg , Entresto   CKD stage 3a, GFR 45-59 ml/min (HCC) Cr stable relative to baseline 1.3          Subjective: Patient is doing well, waiting heart cath later today, has no chest pain, has a mild headache, has no abdominal pain or vomiting.     Physical  Exam: BP 108/64   Pulse 68   Temp 99.3 F (37.4 C) (Oral)   Resp 20   Ht 6' 1 (1.854 m)   Wt 86.3 kg   SpO2 92%   BMI 25.10 kg/m   Elderly adult male, lying in bed, interactive and appropriate RRR, no murmurs, no peripheral edema Respiratory rate normal, lungs clear without rales or wheezes Abdomen soft without tenderness palpation or guarding, no ascites or distention Attention normal, affect appropriate, judgment and insight appear normal, speech slow, face symmetric, moves all extremities with normal strength and coordination    Data Reviewed: Discussed with cardiology Ultrasound shows possible focal dilation of the CBD, normal gallbladder Total bilirubin 1.4, no change, mostly indirect bilirubin Her blood cell count down to 10 Basic metabolic panel shows normal creatinine, mild hyponatremia Troponin trending down to 15,000 AST 105, no change Hemoglobin A1c 6.4%  Family Communication: None present    Disposition: Status is: Inpatient         Author: Lonni SHAUNNA Dalton, MD 09/19/2023 3:11 PM  For on call review [email or web address].

## 2023-09-19 NOTE — Consult Note (Addendum)
 Cardiology Consultation   Patient ID: Richard Mclaughlin MRN: [id number]; DOB: Jun 26, 1940  Admit date: 09/18/2023 Date of Consult: 09/19/2023  PCP:  Fannie Homestead, MD    HeartCare Providers Cardiologist:  None   {   Patient Profile:   Richard Mclaughlin is a 83 y.o. male with a hx of hypertension, hyperlipidemia type 2 diabetes who is being seen 09/19/2023 for the evaluation of STEMI transferred from [facility] ED.  History of Present Illness:   Richard Mclaughlin patient reports no prior cardiac history.  He reports possibly father who had a MI but he is uncertain.  He is to be a prior smoker for probably 15+ years, 1+ pack per day and smoked a pipe.  No drugs.  Drinks frequently but lately reports about 1 year day.  Can stop at any time.  Patient was initially seen at [facility] emergency department for complaints of abdominal pain with radiation to his back that had been going on since Tuesday.  It was severe and woke him up from his sleep and continued until Wednesday.  Only went to the emergency room because he wanted to know what he should do differently as far as diet.  By Thursday he really did not have any symptoms.  Initiate endorse any complaints of chest pain however has had no prior episodes of this however he did start reporting exertional chest pain a couple weeks ago without any radiating or other associated symptoms.  In the ED EKG was concerning for inferior STEMI.  Dr. [place] was called and because he had improved pain, reported that he did not need emergent transfer to the Cath Lab.  Today he reports no significant symptoms.  His abdominal pain has essentially gone away now.  He is stable without any issues.  Reports that he lives at home with his wife.  Denies any peripheral edema, with palpitations, dizziness, lightheadedness, falls.  Troponins have gone from 15,946, 20,800.  Ultrasound of the abdomen negative.  Chest x-ray negative.  Hemoglobin 14.4.  WBC  10.6. CMP yesterday shows potassium 3.9.  Creatinine 1.34  Past Medical History:  Diagnosis Date   CKD stage 3a, GFR 45-59 ml/min (HCC) 09/19/2023   High cholesterol    Hypertension 09/19/2023   Non-insulin  dependent type 2 diabetes mellitus (HCC) 09/19/2023   OSA (obstructive sleep apnea) 09/19/2023    History reviewed. No pertinent surgical history.   Inpatient Medications: Scheduled Meds:  aspirin  EC  81 mg Oral Daily   insulin  aspart  0-6 Units Subcutaneous Q4H   Continuous Infusions:  heparin  1,100 Units/hr (09/19/23 0405)   PRN Meds: acetaminophen , nitroGLYCERIN , ondansetron  (ZOFRAN ) IV, oxyCODONE   Allergies:    Allergies  Allergen Reactions   Azithromycin Other (See Comments)    Hemolytic anemia   Simvastatin     Other reaction(s): Muscle Pain    Social History:   Social History   Socioeconomic History   Marital status: Married    Spouse name: Not on file   Number of children: Not on file   Years of education: Not on file   Highest education level: Not on file  Occupational History   Not on file  Tobacco Use   Smoking status: Former    Current packs/day: 0.00    Types: Cigarettes    Quit date: 06/04/1983    Years since quitting: 40.3   Smokeless tobacco: Not on file  Substance and Sexual Activity   Alcohol use: Yes   Drug use: No   Sexual  activity: Yes  Other Topics Concern   Not on file  Social History Narrative   Not on file   Social Drivers of Health   Financial Resource Strain: Not on file  Food Insecurity: No Food Insecurity (09/19/2023)   Hunger Vital Sign    Worried About Running Out of Food in the Last Year: Never true    Ran Out of Food in the Last Year: Never true  Transportation Needs: No Transportation Needs (09/19/2023)   PRAPARE - Administrator, Civil Service (Medical): No    Lack of Transportation (Non-Medical): No  Physical Activity: Not on file  Stress: Not on file  Social Connections: Moderately Integrated  (09/19/2023)   Social Connection and Isolation Panel [NHANES]    Frequency of Communication with Friends and Family: Once a week    Frequency of Social Gatherings with Friends and Family: More than three times a week    Attends Religious Services: 1 to 4 times per year    Active Member of [employer] or Organizations: No    Attends Banker Meetings: Never    Marital Status: Married  Catering manager Violence: Not At Risk (09/19/2023)   Humiliation, Afraid, Rape, and Kick questionnaire    Fear of Current or Ex-Partner: No    Emotionally Abused: No    Physically Abused: No    Sexually Abused: No    Family History:   History reviewed. No pertinent family history.   ROS:  Please see the history of present illness.  All other ROS reviewed and negative.     Physical Exam/Data:   Vitals:   09/18/23 2330 09/19/23 0043 09/19/23 0520 09/19/23 0600  BP: (!) 141/80 132/60 129/74   Pulse: 82 87 90   Resp: (!) 22 20 20    Temp:  99 F (37.2 C) 100.3 F (37.9 C)   TempSrc:  Oral Oral   SpO2: 91% 95% 92%   Weight: 86.2 kg 86.3 kg  86.3 kg  Height: 6\' 1"  (1.854 m)      No intake or output data in the 24 hours ending 09/19/23 0704    09/19/2023    6:00 AM 09/19/2023   12:43 AM 09/18/2023   11:30 PM  Last 3 Weights  Weight (lbs) 190 lb 4.1 oz 190 lb 4.1 oz 190 lb  Weight (kg) 86.3 kg 86.3 kg 86.183 kg     Body mass index is 25.1 kg/m.  General:  Well nourished, well developed, in no acute distress HEENT: normal Neck: no JVD Vascular: No carotid bruits; Distal pulses 2+ bilaterally Cardiac:  normal S1, S2; RRR; 2/6 mumur Lungs:  clear to auscultation bilaterally, no wheezing, rhonchi or rales  Abd: soft, nontender, no hepatomegaly  Ext: no edema Musculoskeletal:  No deformities, BUE and BLE strength normal and equal Skin: warm and dry  Neuro:  CNs 2-12 intact, no focal abnormalities noted Psych:  Normal affect   EKG:  The EKG was personally reviewed and demonstrates:  Initial EKG showed inferior STEMI with anterolateral depressions.  Repeat EKG shows some improvement in reciprocal changes but still evident inferior STEMI. Telemetry:  Telemetry was personally reviewed and demonstrates: Sinus rhythm heart rates in the 90s.  Relevant CV Studies:   Laboratory Data:  High Sensitivity Troponin:   Recent Labs  Lab 09/18/23 1736 09/18/23 2048  TROPONINIHS 15,946* 20,800*     Chemistry Recent Labs  Lab 09/18/23 1736  NA 134*  K 3.9  CL 97*  CO2 27  GLUCOSE 167*  BUN 18  CREATININE 1.34*  CALCIUM  10.6*  GFRNONAA 53*  ANIONGAP 10    Recent Labs  Lab 09/18/23 1736  PROT 7.9  ALBUMIN 4.4  AST 130*  ALT 32  ALKPHOS 57  BILITOT 1.4*   Lipids No results for input(s): "CHOL", "TRIG", "HDL", "LABVLDL", "LDLCALC", "CHOLHDL" in the last 168 hours.  Hematology Recent Labs  Lab 09/18/23 1736 09/19/23 0253  WBC 12.0* 10.6*  RBC 4.71 4.50  HGB 15.3 14.4  HCT 43.3 41.0  MCV 91.9 91.1  MCH 32.5 32.0  MCHC 35.3 35.1  RDW 12.1 12.2  PLT 202 187   Thyroid No results for input(s): "TSH", "FREET4" in the last 168 hours.  BNPNo results for input(s): "BNP", "PROBNP" in the last 168 hours.  DDimer No results for input(s): "DDIMER" in the last 168 hours.   Radiology/Studies:  US  Abdomen Limited Result Date: 09/18/2023 CLINICAL DATA:  Epigastric abdominal pain EXAM: ULTRASOUND ABDOMEN LIMITED RIGHT UPPER QUADRANT COMPARISON:  Oct 02, 2015 FINDINGS: Gallbladder: No gallstones or wall thickening visualized. No sonographic Murphy sign noted by sonographer. Common bile duct: Diameter: Suboptimally evaluated. Mildly dilated, partially visualized segment of the common bile duct measuring 8 mm. Liver: No focal lesion identified. Within normal limits in parenchymal echogenicity. Portal vein is patent on color Doppler imaging with normal direction of blood flow towards the liver. Other: None. IMPRESSION: 1. No cholecystolithiasis or changes of acute  cholecystitis. 2. A partially visualized, mildly dilated segment of the common bile duct is noted measuring 8 mm. This may be within normal limits for patient's given age. However, correlation with serum bilirubin is recommended. Electronically Signed   By: Rance Burrows M.D.   On: 09/18/2023 19:57   DG Chest Port 1 View Result Date: 09/18/2023 CLINICAL DATA:  Epigastric abdominal pain EXAM: PORTABLE CHEST 1 VIEW COMPARISON:  January 23, 2021 FINDINGS: No focal airspace consolidation, pleural effusion, or pneumothorax. No cardiomegaly. Tortuous aorta with aortic atherosclerosis. No acute fracture or destructive lesions. Multilevel thoracic osteophytosis. IMPRESSION: No acute cardiopulmonary abnormality. Electronically Signed   By: Rance Burrows M.D.   On: 09/18/2023 19:54     Assessment and Plan:   Inferior STEMI He has been reporting symptoms of abdominal pain since Tuesday that woke him up from his sleep.  By Thursday his pain has greatly improved and now has no pain at all, stable.  EKG with inferior ST elevation with reciprocal changes in anterolateral leads.  Troponins as high as almost 21,000.  Discussed case with on-call STEMI doctor Dr. Berry Bristol, likely has completed his infarct, no need for urgent cardiac catheterization but will still get in in a timely fashion.  Patient is agreeable to proceed with cardiac catheterization today. Continue IV heparin , give aspirin , start Lopressor  25 mg twice daily, start atorvastatin  80 mg Obtain echocardiogram, LP(a), lipid panel  Type 2 diabetes A1c 6.10 July 2023.  Would benefit from SGLT2 at discharge.  Hyperlipidemia Was not on statin, start atorvastatin  now.    CKD Will get BMP this morning, yesterday was 1.3 and should be stable for cardiac catheterization  OSA on CPAP  Informed Consent   Shared Decision Making/Informed Consent{  The risks [stroke (1 in 1000), death (1 in 1000), kidney failure [usually temporary] (1 in 500), bleeding  (1 in 200), allergic reaction [possibly serious] (1 in 200)], benefits (diagnostic support and management of coronary artery disease) and alternatives of a cardiac catheterization were discussed in detail with Richard Mclaughlin and he is willing  to proceed.       Risk Assessment/Risk Scores:   TIMI Risk Score for ST  Elevation MI:   The patient's TIMI risk score is 5, which indicates a 12.4% risk of all cause mortality at 30 days.{     For questions or updates, please contact [facility] Please consult [email or web address] for contact info under    Signed, Burnetta Cart, PA-C  09/19/2023 7:04 AM

## 2023-09-19 NOTE — Assessment & Plan Note (Signed)
 Mild, asymptomatic - Trend BMP post-cath

## 2023-09-19 NOTE — Assessment & Plan Note (Addendum)
 BP soft - Stop lisinopril - Continue new Coreg , Entresto 

## 2023-09-19 NOTE — Assessment & Plan Note (Addendum)
 Ischemic cardiomyopathy Found to have reduced EF on LHC.  - Follow echo - Start Entresto , Coreg , Jardiance  - Stop lisinopril

## 2023-09-19 NOTE — Hospital Course (Addendum)
 83 y.o. M with HTN, DM, HLD, CKD IIIa baseline 1.3 and OSA on CPAP who presented with several days exertional epigastric pain.  In the ER, ECG showed ST changes, troponin found to be >20K.  Admitted on heparin  gtt for NSTEMI.

## 2023-09-19 NOTE — Assessment & Plan Note (Signed)
Cr stable relative to baseline 1.3 

## 2023-09-19 NOTE — H&P (View-Only) (Signed)
 Cardiology Consultation   Patient ID: Richard Mclaughlin MRN: [id number]; DOB: Jun 26, 1940  Admit date: 09/18/2023 Date of Consult: 09/19/2023  PCP:  Fannie Homestead, MD    HeartCare Providers Cardiologist:  None   {   Patient Profile:   Richard Mclaughlin is a 83 y.o. male with a hx of hypertension, hyperlipidemia type 2 diabetes who is being seen 09/19/2023 for the evaluation of STEMI transferred from [facility].  History of Present Illness:   Richard Mclaughlin patient reports no prior cardiac history.  He reports possibly father who had a MI but he is uncertain.  He is to be a prior smoker for probably 15+ years, 1+ pack per day and smoked a pipe.  No drugs.  Drinks frequently but lately reports about 1 year day.  Can stop at any time.  Patient was initially seen at [facility] emergency department for complaints of abdominal pain with radiation to his back that had been going on since Tuesday.  It was severe and woke him up from his sleep and continued until Wednesday.  Only went to the emergency room because he wanted to know what he should do differently as far as diet.  By Thursday he really did not have any symptoms.  Initiate endorse any complaints of chest pain however has had no prior episodes of this however he did start reporting exertional chest pain a couple weeks ago without any radiating or other associated symptoms.  In the ED EKG was concerning for inferior STEMI.  Dr. [place] was called and because he had improved pain, reported that he did not need emergent transfer to the Cath Lab.  Today he reports no significant symptoms.  His abdominal pain has essentially gone away now.  He is stable without any issues.  Reports that he lives at home with his wife.  Denies any peripheral edema, with palpitations, dizziness, lightheadedness, falls.  Troponins have gone from 15,946, 20,800.  Ultrasound of the abdomen negative.  Chest x-ray negative.  Hemoglobin 14.4.  WBC  10.6. CMP yesterday shows potassium 3.9.  Creatinine 1.34  Past Medical History:  Diagnosis Date   CKD stage 3a, GFR 45-59 ml/min (HCC) 09/19/2023   High cholesterol    Hypertension 09/19/2023   Non-insulin  dependent type 2 diabetes mellitus (HCC) 09/19/2023   OSA (obstructive sleep apnea) 09/19/2023    History reviewed. No pertinent surgical history.   Inpatient Medications: Scheduled Meds:  aspirin  EC  81 mg Oral Daily   insulin  aspart  0-6 Units Subcutaneous Q4H   Continuous Infusions:  heparin  1,100 Units/hr (09/19/23 0405)   PRN Meds: acetaminophen , nitroGLYCERIN , ondansetron  (ZOFRAN ) IV, oxyCODONE   Allergies:    Allergies  Allergen Reactions   Azithromycin Other (See Comments)    Hemolytic anemia   Simvastatin     Other reaction(s): Muscle Pain    Social History:   Social History   Socioeconomic History   Marital status: Married    Spouse name: Not on file   Number of children: Not on file   Years of education: Not on file   Highest education level: Not on file  Occupational History   Not on file  Tobacco Use   Smoking status: Former    Current packs/day: 0.00    Types: Cigarettes    Quit date: 06/04/1983    Years since quitting: 40.3   Smokeless tobacco: Not on file  Substance and Sexual Activity   Alcohol use: Yes   Drug use: No   Sexual  activity: Yes  Other Topics Concern   Not on file  Social History Narrative   Not on file   Social Drivers of Health   Financial Resource Strain: Not on file  Food Insecurity: No Food Insecurity (09/19/2023)   Hunger Vital Sign    Worried About Running Out of Food in the Last Year: Never true    Ran Out of Food in the Last Year: Never true  Transportation Needs: No Transportation Needs (09/19/2023)   PRAPARE - Administrator, Civil Service (Medical): No    Lack of Transportation (Non-Medical): No  Physical Activity: Not on file  Stress: Not on file  Social Connections: Moderately Integrated  (09/19/2023)   Social Connection and Isolation Panel [NHANES]    Frequency of Communication with Friends and Family: Once a week    Frequency of Social Gatherings with Friends and Family: More than three times a week    Attends Religious Services: 1 to 4 times per year    Active Member of [employer] or Organizations: No    Attends Banker Meetings: Never    Marital Status: Married  Catering manager Violence: Not At Risk (09/19/2023)   Humiliation, Afraid, Rape, and Kick questionnaire    Fear of Current or Ex-Partner: No    Emotionally Abused: No    Physically Abused: No    Sexually Abused: No    Family History:   History reviewed. No pertinent family history.   ROS:  Please see the history of present illness.  All other ROS reviewed and negative.     Physical Exam/Data:   Vitals:   09/18/23 2330 09/19/23 0043 09/19/23 0520 09/19/23 0600  BP: (!) 141/80 132/60 129/74   Pulse: 82 87 90   Resp: (!) 22 20 20    Temp:  99 F (37.2 C) 100.3 F (37.9 C)   TempSrc:  Oral Oral   SpO2: 91% 95% 92%   Weight: 86.2 kg 86.3 kg  86.3 kg  Height: 6\' 1"  (1.854 m)      No intake or output data in the 24 hours ending 09/19/23 0704    09/19/2023    6:00 AM 09/19/2023   12:43 AM 09/18/2023   11:30 PM  Last 3 Weights  Weight (lbs) 190 lb 4.1 oz 190 lb 4.1 oz 190 lb  Weight (kg) 86.3 kg 86.3 kg 86.183 kg     Body mass index is 25.1 kg/m.  General:  Well nourished, well developed, in no acute distress HEENT: normal Neck: no JVD Vascular: No carotid bruits; Distal pulses 2+ bilaterally Cardiac:  normal S1, S2; RRR; 2/6 mumur Lungs:  clear to auscultation bilaterally, no wheezing, rhonchi or rales  Abd: soft, nontender, no hepatomegaly  Ext: no edema Musculoskeletal:  No deformities, BUE and BLE strength normal and equal Skin: warm and dry  Neuro:  CNs 2-12 intact, no focal abnormalities noted Psych:  Normal affect   EKG:  The EKG was personally reviewed and demonstrates:  Initial EKG showed inferior STEMI with anterolateral depressions.  Repeat EKG shows some improvement in reciprocal changes but still evident inferior STEMI. Telemetry:  Telemetry was personally reviewed and demonstrates: Sinus rhythm heart rates in the 90s.  Relevant CV Studies:   Laboratory Data:  High Sensitivity Troponin:   Recent Labs  Lab 09/18/23 1736 09/18/23 2048  TROPONINIHS 15,946* 20,800*     Chemistry Recent Labs  Lab 09/18/23 1736  NA 134*  K 3.9  CL 97*  CO2 27  GLUCOSE 167*  BUN 18  CREATININE 1.34*  CALCIUM  10.6*  GFRNONAA 53*  ANIONGAP 10    Recent Labs  Lab 09/18/23 1736  PROT 7.9  ALBUMIN 4.4  AST 130*  ALT 32  ALKPHOS 57  BILITOT 1.4*   Lipids No results for input(s): "CHOL", "TRIG", "HDL", "LABVLDL", "LDLCALC", "CHOLHDL" in the last 168 hours.  Hematology Recent Labs  Lab 09/18/23 1736 09/19/23 0253  WBC 12.0* 10.6*  RBC 4.71 4.50  HGB 15.3 14.4  HCT 43.3 41.0  MCV 91.9 91.1  MCH 32.5 32.0  MCHC 35.3 35.1  RDW 12.1 12.2  PLT 202 187   Thyroid No results for input(s): "TSH", "FREET4" in the last 168 hours.  BNPNo results for input(s): "BNP", "PROBNP" in the last 168 hours.  DDimer No results for input(s): "DDIMER" in the last 168 hours.   Radiology/Studies:  US  Abdomen Limited Result Date: 09/18/2023 CLINICAL DATA:  Epigastric abdominal pain EXAM: ULTRASOUND ABDOMEN LIMITED RIGHT UPPER QUADRANT COMPARISON:  Oct 02, 2015 FINDINGS: Gallbladder: No gallstones or wall thickening visualized. No sonographic Murphy sign noted by sonographer. Common bile duct: Diameter: Suboptimally evaluated. Mildly dilated, partially visualized segment of the common bile duct measuring 8 mm. Liver: No focal lesion identified. Within normal limits in parenchymal echogenicity. Portal vein is patent on color Doppler imaging with normal direction of blood flow towards the liver. Other: None. IMPRESSION: 1. No cholecystolithiasis or changes of acute  cholecystitis. 2. A partially visualized, mildly dilated segment of the common bile duct is noted measuring 8 mm. This may be within normal limits for patient's given age. However, correlation with serum bilirubin is recommended. Electronically Signed   By: Rance Burrows M.D.   On: 09/18/2023 19:57   DG Chest Port 1 View Result Date: 09/18/2023 CLINICAL DATA:  Epigastric abdominal pain EXAM: PORTABLE CHEST 1 VIEW COMPARISON:  January 23, 2021 FINDINGS: No focal airspace consolidation, pleural effusion, or pneumothorax. No cardiomegaly. Tortuous aorta with aortic atherosclerosis. No acute fracture or destructive lesions. Multilevel thoracic osteophytosis. IMPRESSION: No acute cardiopulmonary abnormality. Electronically Signed   By: Rance Burrows M.D.   On: 09/18/2023 19:54     Assessment and Plan:   Inferior STEMI He has been reporting symptoms of abdominal pain since Tuesday that woke him up from his sleep.  By Thursday his pain has greatly improved and now has no pain at all, stable.  EKG with inferior ST elevation with reciprocal changes in anterolateral leads.  Troponins as high as almost 21,000.  Discussed case with on-call STEMI doctor Dr. Berry Bristol, likely has completed his infarct, no need for urgent cardiac catheterization but will still get in in a timely fashion.  Patient is agreeable to proceed with cardiac catheterization today. Continue IV heparin , give aspirin , start Lopressor  25 mg twice daily, start atorvastatin  80 mg Obtain echocardiogram, LP(a), lipid panel  Type 2 diabetes A1c 6.10 July 2023.  Would benefit from SGLT2 at discharge.  Hyperlipidemia Was not on statin, start atorvastatin  now.    CKD Will get BMP this morning, yesterday was 1.3 and should be stable for cardiac catheterization  OSA on CPAP  Informed Consent   Shared Decision Making/Informed Consent{  The risks [stroke (1 in 1000), death (1 in 1000), kidney failure [usually temporary] (1 in 500), bleeding  (1 in 200), allergic reaction [possibly serious] (1 in 200)], benefits (diagnostic support and management of coronary artery disease) and alternatives of a cardiac catheterization were discussed in detail with Mr. Squier and he is willing  to proceed.       Risk Assessment/Risk Scores:   TIMI Risk Score for ST  Elevation MI:   The patient's TIMI risk score is 5, which indicates a 12.4% risk of all cause mortality at 30 days.{     For questions or updates, please contact [facility] Please consult [email or web address] for contact info under    Signed, Burnetta Cart, PA-C  09/19/2023 7:04 AM

## 2023-09-19 NOTE — Assessment & Plan Note (Signed)
 Glucose controlled.  Diet controlled at baseline. A1c 6.4% - Continue SS correction insulin 

## 2023-09-19 NOTE — Plan of Care (Signed)
 Problem: Health Behavior/Discharge Planning: Goal: Ability to manage health-related needs will improve Outcome: Progressing   Problem: Clinical Measurements: Goal: Ability to maintain clinical measurements within normal limits will improve Outcome: Progressing Goal: Will remain free from infection Outcome: Progressing Goal: Diagnostic test results will improve Outcome: Progressing Goal: Respiratory complications will improve Outcome: Progressing Goal: Cardiovascular complication will be avoided Outcome: Progressing   Problem: Activity: Goal: Risk for activity intolerance will decrease Outcome: Progressing   Problem: Nutrition: Goal: Adequate nutrition will be maintained Outcome: Progressing   Problem: Coping: Goal: Level of anxiety will decrease Outcome: Progressing   Problem: Elimination: Goal: Will not experience complications related to bowel motility Outcome: Progressing Goal: Will not experience complications related to urinary retention Outcome: Progressing   Problem: Pain Managment: Goal: General experience of comfort will improve and/or be controlled Outcome: Progressing   Problem: Safety: Goal: Ability to remain free from injury will improve Outcome: Progressing   Problem: Skin Integrity: Goal: Risk for impaired skin integrity will decrease Outcome: Progressing   Problem: Education: Goal: Ability to describe self-care measures that may prevent or decrease complications (Diabetes Survival Skills Education) will improve Outcome: Progressing Goal: Individualized Educational Video(s) Outcome: Progressing   Problem: Coping: Goal: Ability to adjust to condition or change in health will improve Outcome: Progressing   Problem: Fluid Volume: Goal: Ability to maintain a balanced intake and output will improve Outcome: Progressing   Problem: Health Behavior/Discharge Planning: Goal: Ability to identify and utilize available resources and services will  improve Outcome: Progressing Goal: Ability to manage health-related needs will improve Outcome: Progressing   Problem: Metabolic: Goal: Ability to maintain appropriate glucose levels will improve Outcome: Progressing   Problem: Nutritional: Goal: Maintenance of adequate nutrition will improve Outcome: Progressing Goal: Progress toward achieving an optimal weight will improve Outcome: Progressing   Problem: Skin Integrity: Goal: Risk for impaired skin integrity will decrease Outcome: Progressing   Problem: Tissue Perfusion: Goal: Adequacy of tissue perfusion will improve Outcome: Progressing   Problem: Education: Goal: Understanding of cardiac disease, CV risk reduction, and recovery process will improve Outcome: Progressing Goal: Individualized Educational Video(s) Outcome: Progressing   Problem: Activity: Goal: Ability to tolerate increased activity will improve Outcome: Progressing   Problem: Cardiac: Goal: Ability to achieve and maintain adequate cardiovascular perfusion will improve Outcome: Progressing   Problem: Health Behavior/Discharge Planning: Goal: Ability to safely manage health-related needs after discharge will improve Outcome: Progressing   Problem: Education: Goal: Ability to describe self-care measures that may prevent or decrease complications (Diabetes Survival Skills Education) will improve Outcome: Progressing Goal: Individualized Educational Video(s) Outcome: Progressing   Problem: Coping: Goal: Ability to adjust to condition or change in health will improve Outcome: Progressing   Problem: Fluid Volume: Goal: Ability to maintain a balanced intake and output will improve Outcome: Progressing   Problem: Health Behavior/Discharge Planning: Goal: Ability to identify and utilize available resources and services will improve Outcome: Progressing Goal: Ability to manage health-related needs will improve Outcome: Progressing   Problem:  Metabolic: Goal: Ability to maintain appropriate glucose levels will improve Outcome: Progressing   Problem: Nutritional: Goal: Maintenance of adequate nutrition will improve Outcome: Progressing Goal: Progress toward achieving an optimal weight will improve Outcome: Progressing   Problem: Skin Integrity: Goal: Risk for impaired skin integrity will decrease Outcome: Progressing   Problem: Tissue Perfusion: Goal: Adequacy of tissue perfusion will improve Outcome: Progressing   Problem: Education: Goal: Understanding of CV disease, CV risk reduction, and recovery process will improve Outcome: Progressing Goal: Individualized Educational  Video(s) Outcome: Progressing   Problem: Activity: Goal: Ability to return to baseline activity level will improve Outcome: Progressing

## 2023-09-19 NOTE — ED Notes (Signed)
 [facility] at bedside

## 2023-09-20 DIAGNOSIS — I255 Ischemic cardiomyopathy: Secondary | ICD-10-CM | POA: Diagnosis not present

## 2023-09-20 DIAGNOSIS — I213 ST elevation (STEMI) myocardial infarction of unspecified site: Secondary | ICD-10-CM

## 2023-09-20 DIAGNOSIS — N179 Acute kidney failure, unspecified: Secondary | ICD-10-CM

## 2023-09-20 DIAGNOSIS — E7849 Other hyperlipidemia: Secondary | ICD-10-CM

## 2023-09-20 DIAGNOSIS — E118 Type 2 diabetes mellitus with unspecified complications: Secondary | ICD-10-CM

## 2023-09-20 LAB — COMPREHENSIVE METABOLIC PANEL WITH GFR
ALT: 26 U/L (ref 0–44)
AST: 61 U/L — ABNORMAL HIGH (ref 15–41)
Albumin: 3.2 g/dL — ABNORMAL LOW (ref 3.5–5.0)
Alkaline Phosphatase: 43 U/L (ref 38–126)
Anion gap: 13 (ref 5–15)
BUN: 26 mg/dL — ABNORMAL HIGH (ref 8–23)
CO2: 20 mmol/L — ABNORMAL LOW (ref 22–32)
Calcium: 9.3 mg/dL (ref 8.9–10.3)
Chloride: 101 mmol/L (ref 98–111)
Creatinine, Ser: 1.51 mg/dL — ABNORMAL HIGH (ref 0.61–1.24)
GFR, Estimated: 46 mL/min — ABNORMAL LOW (ref >60)
Glucose, Bld: 127 mg/dL — ABNORMAL HIGH (ref 70–99)
Potassium: 4.1 mmol/L (ref 3.5–5.1)
Sodium: 134 mmol/L — ABNORMAL LOW (ref 135–145)
Total Bilirubin: 1.4 mg/dL — ABNORMAL HIGH (ref 0.0–1.2)
Total Protein: 6.6 g/dL (ref 6.5–8.1)

## 2023-09-20 LAB — HEPATITIS PANEL, ACUTE
HCV Ab: NONREACTIVE
Hep A IgM: NONREACTIVE
Hep B C IgM: NONREACTIVE
Hepatitis B Surface Ag: NONREACTIVE

## 2023-09-20 LAB — GLUCOSE, CAPILLARY
Glucose-Capillary: 126 mg/dL — ABNORMAL HIGH (ref 70–99)
Glucose-Capillary: 135 mg/dL — ABNORMAL HIGH (ref 70–99)
Glucose-Capillary: 135 mg/dL — ABNORMAL HIGH (ref 70–99)
Glucose-Capillary: 141 mg/dL — ABNORMAL HIGH (ref 70–99)
Glucose-Capillary: 146 mg/dL — ABNORMAL HIGH (ref 70–99)
Glucose-Capillary: 167 mg/dL — ABNORMAL HIGH (ref 70–99)
Glucose-Capillary: 178 mg/dL — ABNORMAL HIGH (ref 70–99)

## 2023-09-20 LAB — CBC
HCT: 40.1 % (ref 39.0–52.0)
Hemoglobin: 13.9 g/dL (ref 13.0–17.0)
MCH: 32.6 pg (ref 26.0–34.0)
MCHC: 34.7 g/dL (ref 30.0–36.0)
MCV: 93.9 fL (ref 80.0–100.0)
Platelets: 169 10*3/uL (ref 150–400)
RBC: 4.27 MIL/uL (ref 4.22–5.81)
RDW: 12.2 % (ref 11.5–15.5)
WBC: 8.9 10*3/uL (ref 4.0–10.5)
nRBC: 0.2 % (ref 0.0–0.2)

## 2023-09-20 NOTE — Progress Notes (Signed)
 Progress Note  Patient Name: Richard Mclaughlin Date of Encounter: 09/20/2023  Primary Cardiologist: None  Subjective   No symptoms.  Inpatient Medications    Scheduled Meds:  aspirin  EC  81 mg Oral Daily   atorvastatin   80 mg Oral Daily   carvedilol   3.125 mg Oral BID WC   clopidogrel   75 mg Oral Daily   empagliflozin   10 mg Oral Daily   insulin  aspart  0-6 Units Subcutaneous Q4H   sacubitril -valsartan   1 tablet Oral BID   Continuous Infusions:  PRN Meds: nitroGLYCERIN , sodium chloride  flush   Vital Signs    Vitals:   09/19/23 1936 09/20/23 0017 09/20/23 0412 09/20/23 0815  BP: 105/61 113/64 118/75 115/70  Pulse: 73 73 76 83  Resp: 18 18 17 20   Temp: 99 F (37.2 C) 98.9 F (37.2 C) 98.9 F (37.2 C) 99.2 F (37.3 C)  TempSrc: Oral Oral Oral Oral  SpO2: 93% 92% 94% 92%  Weight:   84.9 kg   Height:        Intake/Output Summary (Last 24 hours) at 09/20/2023 1221 Last data filed at 09/20/2023 0750 Gross per 24 hour  Intake 260 ml  Output 475 ml  Net -215 ml   Filed Weights   09/19/23 0043 09/19/23 0600 09/20/23 0412  Weight: 86.3 kg 86.3 kg 84.9 kg    Telemetry     Personally reviewed.  NSR.  ECG    Not performed today.  Physical Exam   GEN: No acute distress.   Neck: No JVD. Cardiac: RRR, no murmur, rub, or gallop.  Respiratory: Nonlabored. Clear to auscultation bilaterally. GI: Soft, nontender, bowel sounds present. MS: No edema; No deformity. Neuro:  Nonfocal. Psych: Alert and oriented x 3. Normal affect.  Labs    Chemistry Recent Labs  Lab 09/18/23 1736 09/19/23 0633 09/20/23 0323  NA 134* 134* 134*  K 3.9 3.7 4.1  CL 97* 102 101  CO2 27 22 20*  GLUCOSE 167* 152* 127*  BUN 18 17 26*  CREATININE 1.34* 1.18 1.51*  CALCIUM  10.6* 9.6 9.3  PROT 7.9 7.1 6.6  ALBUMIN 4.4 3.4* 3.2*  AST 130* 105* 61*  ALT 32 30 26  ALKPHOS 57 48 43  BILITOT 1.4* 1.4* 1.4*  GFRNONAA 53* >60 46*  ANIONGAP 10 10 13      Hematology Recent  Labs  Lab 09/18/23 1736 09/19/23 0253 09/20/23 0323  WBC 12.0* 10.6* 8.9  RBC 4.71 4.50 4.27  HGB 15.3 14.4 13.9  HCT 43.3 41.0 40.1  MCV 91.9 91.1 93.9  MCH 32.5 32.0 32.6  MCHC 35.3 35.1 34.7  RDW 12.1 12.2 12.2  PLT 202 187 169    Cardiac Enzymes Recent Labs  Lab 09/18/23 1736 09/18/23 2048 09/19/23 0633  TROPONINIHS 15,946* 20,800* 15,283*    BNPNo results for input(s): "BNP", "PROBNP" in the last 168 hours.   DDimerNo results for input(s): "DDIMER" in the last 168 hours.   Radiology    ECHOCARDIOGRAM COMPLETE Result Date: 09/19/2023    ECHOCARDIOGRAM REPORT   Patient Name:   Richard Mclaughlin Date of Exam: 09/19/2023 Medical Rec #:  [id number]            Height:       73.0 in Accession #:    [id number]           Weight:       190.3 lb Date of Birth:  [birth year]  BSA:          2.106 m Patient Age:    82 years             BP:           102/62 mmHg Patient Gender: M                    HR:           68 bpm. Exam Location:  Inpatient Procedure: 2D Echo, Color Doppler, Cardiac Doppler and Intracardiac            Opacification Agent (Both Spectral and Color Flow Doppler were            utilized during procedure). Indications:    Myocardial Infarction i21.9  History:        Patient has prior history of Echocardiogram examinations, most                 recent 02/10/2023. CHF, CAD; Risk Factors:Sleep Apnea,                 Hypertension, Diabetes and Dyslipidemia.  Sonographer:    Sherline Distel Senior RDCS Referring Phys: Darline Eis HALEY IMPRESSIONS  1. Poor acoustic windows Definity  used. There is severe hypokinesis/akinesis of the lateral wall, anterior wall, inferior wall. Left ventricular ejection fraction, by estimation, is 25%. The left ventricle has severely decreased function. There is mild left ventricular hypertrophy.  2. Right ventricular systolic function is normal. The right ventricular size is normal.  3. The mitral valve is normal in structure. Trivial mitral valve  regurgitation.  4. The aortic valve is tricuspid. Aortic valve regurgitation is not visualized. Aortic valve sclerosis/calcification is present, without any evidence of aortic stenosis.  5. The inferior vena cava is normal in size with greater than 50% respiratory variability, suggesting right atrial pressure of 3 mmHg. FINDINGS  Left Ventricle: Poor acoustic windows Definity  used. There is severe hypokinesis/akinesis of the lateral wall, anterior wall, inferior wall. Left ventricular ejection fraction, by estimation, is 25%. The left ventricle has severely decreased function. Definity  contrast agent was given IV to delineate the left ventricular endocardial borders. The left ventricular internal cavity size was normal in size. There is mild left ventricular hypertrophy. Right Ventricle: The right ventricular size is normal. Right vetricular wall thickness was not assessed. Right ventricular systolic function is normal. Left Atrium: Left atrial size was normal in size. Right Atrium: Right atrial size was normal in size. Pericardium: There is no evidence of pericardial effusion. Mitral Valve: The mitral valve is normal in structure. Trivial mitral valve regurgitation. Tricuspid Valve: The tricuspid valve is normal in structure. Tricuspid valve regurgitation is trivial. Aortic Valve: The aortic valve is tricuspid. Aortic valve regurgitation is not visualized. Aortic valve sclerosis/calcification is present, without any evidence of aortic stenosis. Pulmonic Valve: The pulmonic valve was normal in structure. Pulmonic valve regurgitation is trivial. Aorta: The aortic root and ascending aorta are structurally normal, with no evidence of dilitation. Venous: The inferior vena cava is normal in size with greater than 50% respiratory variability, suggesting right atrial pressure of 3 mmHg. IAS/Shunts: No atrial level shunt detected by color flow Doppler.  LEFT VENTRICLE PLAX 2D LVIDd:         4.30 cm      Diastology LVIDs:          3.50 cm      LV e' medial:    6.09 cm/s LV PW:  0.80 cm      LV E/e' medial:  9.6 LV IVS:        1.30 cm      LV e' lateral:   4.68 cm/s LVOT diam:     2.00 cm      LV E/e' lateral: 12.5 LV SV:         48 LV SV Index:   23 LVOT Area:     3.14 cm  LV Volumes (MOD) LV vol d, MOD A2C: 86.4 ml LV vol d, MOD A4C: 138.0 ml LV vol s, MOD A2C: 50.3 ml LV vol s, MOD A4C: 77.7 ml LV SV MOD A2C:     36.1 ml LV SV MOD A4C:     138.0 ml LV SV MOD BP:      52.5 ml RIGHT VENTRICLE RV S prime:     9.90 cm/s TAPSE (M-mode): 1.9 cm LEFT ATRIUM             Index        RIGHT ATRIUM           Index LA diam:        2.70 cm 1.28 cm/m   RA Area:     15.80 cm LA Vol (A2C):   51.4 ml 24.40 ml/m  RA Volume:   42.70 ml  20.27 ml/m LA Vol (A4C):   27.6 ml 13.10 ml/m LA Biplane Vol: 39.3 ml 18.66 ml/m  AORTIC VALVE LVOT Vmax:   79.80 cm/s LVOT Vmean:  55.000 cm/s LVOT VTI:    0.152 m  AORTA Ao Root diam: 3.30 cm Ao Asc diam:  4.10 cm MITRAL VALVE MV Area (PHT): 2.55 cm    SHUNTS MV Decel Time: 297 msec    Systemic VTI:  0.15 m MV E velocity: 58.70 cm/s  Systemic Diam: 2.00 cm MV A velocity: 96.40 cm/s MV E/A ratio:  0.61 Ola Berger MD Electronically signed by Ola Berger MD Signature Date/Time: 09/19/2023/4:29:44 PM    Final    CARDIAC CATHETERIZATION Result Date: 09/19/2023 Images from the original result were not included.   1st Diag lesion is 80% stenosed.   2nd Diag lesion is 20% stenosed. Cardiac Catheterization 09/19/23: Hemodynamic data: LVEDP 18 mmHg.  Mild to moderately elevated.  No pressure gradient across the aortic valve. Angiographic data: LV: Entire inferior and inferoapical akinesis.  LVEF 25 to 30%.  No significant mitral regurgitation. RCA: Occluded in the proximal segment.  Faint collaterals to the PDA from LAD. LM: Large-caliber vessel, normal. LAD: Diffusely diseased and mildly calcified throughout.  Proximal segment has a 80% stenosis.  Moderate-sized D-1 with ostial 80% to 90% stenosis small to  moderate-sized D2 with mild 20% ostial stenosis. LCx: Occluded in the proximal segment.  OM1 which appears to be moderate to large size has faint ipsilateral collaterals.  Distal CX not seen. Impression and recommendations: Patient presenting late with inferior and posterior STEMI, essentially living off of LAD now.  LVEF appears to be severely reduced.  Will medically optimize him, started him on Entresto , Jardiance , Coreg  and discontinue metoprolol  tartrate.  Once optimized, if he has any recurrence of angina we will consider PCI to the LAD.   US  Abdomen Limited Result Date: 09/18/2023 CLINICAL DATA:  Epigastric abdominal pain EXAM: ULTRASOUND ABDOMEN LIMITED RIGHT UPPER QUADRANT COMPARISON:  Oct 02, 2015 FINDINGS: Gallbladder: No gallstones or wall thickening visualized. No sonographic Murphy sign noted by sonographer. Common bile duct: Diameter: Suboptimally evaluated. Mildly dilated, partially visualized segment of the  common bile duct measuring 8 mm. Liver: No focal lesion identified. Within normal limits in parenchymal echogenicity. Portal vein is patent on color Doppler imaging with normal direction of blood flow towards the liver. Other: None. IMPRESSION: 1. No cholecystolithiasis or changes of acute cholecystitis. 2. A partially visualized, mildly dilated segment of the common bile duct is noted measuring 8 mm. This may be within normal limits for patient's given age. However, correlation with serum bilirubin is recommended. Electronically Signed   By: Rance Burrows M.D.   On: 09/18/2023 19:57   DG Chest Port 1 View Result Date: 09/18/2023 CLINICAL DATA:  Epigastric abdominal pain EXAM: PORTABLE CHEST 1 VIEW COMPARISON:  January 23, 2021 FINDINGS: No focal airspace consolidation, pleural effusion, or pneumothorax. No cardiomegaly. Tortuous aorta with aortic atherosclerosis. No acute fracture or destructive lesions. Multilevel thoracic osteophytosis. IMPRESSION: No acute cardiopulmonary abnormality.  Electronically Signed   By: Rance Burrows M.D.   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  EKG showed ST elevations in inferior leads, lateral leads (V5 and V6), ST depressions in anterior leads, Q waves in inferior leads. Hs troponins elevated, 15,946>>20,800>>15,283.  LHC showed ostial RCA CTO (collaterals to PDA from LAD), proximal LAD 80% stenosis, D1 80 to 90% stenosis, ostial LCx CTO (OM fills by collaterals from LAD). LV gram showed inferior and inferoapical akinesis, LVEF was 25 to 30%.  No PCI was performed due to absence of symptoms and late presentation of STEMI.  Medical management for ischemic cardiomyopathy was recommended.  If he develops any recurrent angina, he will be considered for LAD PCI. He will need to be discharged on LifeVest due to multivessel CAD (not revascularized) and LVEF 25%.  Ischemic cardiomyopathy with LVEF 25% and no device: Compensated.  Continue GDMT, carvedilol  3.125 mg twice daily, Entresto  24-26 mg twice daily, Jardiance  10 mg once daily.  Outpatient titration of GDMT.  LifeVest upon discharge.  AKI: Upon chart review, he is noted to have mildly elevated serum creatinine, CKD.  Baseline serum creatinine around 1.3.  Mildly elevated, 1.5 today.  Likely secondary to nephrotoxic agents.  Will recheck BMP this evening.  If stable or downtrending, safe to discharge.  Will need LifeVest before going home.  Encourage p.o. hydration.  Type 2 diabetes mellitus: HbA1c 6.6 in February 2025.  Not on diabetic medications.  Aggressive control of DM 2 due to CAD.  OSA on CPAP: Continue CPAP.  HLD, not at goal: LDL 169 in February 2025.  Continue atorvastatin  80 mg nightly.  Goal LDL less than 55.  Signed, Lasalle Pointer, MD  09/20/2023, 12:21 PM

## 2023-09-20 NOTE — TOC Progression Note (Signed)
 Transition of Care [facility]) - Progression Note    Patient Details  Name: Richard Mclaughlin MRN: [id number] Date of Birth: 05/23/1941  [facility] of [facility]) CM/SW Contact  Omie Bickers, RN Phone Number: 09/20/2023, 2:01 PM  Clinical Narrative:     Deana Eves at [facility]. Scanned all clinicals needed for LifeVest to her.        Expected Discharge Plan and Services                                               Social Determinants of Health (SDOH) Interventions SDOH Screenings   Food Insecurity: No Food Insecurity (09/19/2023)  Housing: Low Risk  (09/19/2023)  Transportation Needs: No Transportation Needs (09/19/2023)  Utilities: Not At Risk (09/19/2023)  Social Connections: Moderately Integrated (09/19/2023)  Tobacco Use: Medium Risk (09/19/2023)    Readmission Risk Interventions     No data to display

## 2023-09-20 NOTE — Progress Notes (Addendum)
 Mobility Specialist Progress Note:   09/20/23 1318  Therapy Vitals  Pulse Rate 76  BP 107/63  Oxygen Therapy  SpO2 92 %  O2 Device Room Air  Mobility  Activity Ambulated with assistance in hallway  Level of Assistance Standby assist, set-up cues, supervision of patient - no hands on  Assistive Device None  Distance Ambulated (ft) 500 ft  Activity Response Tolerated well  Mobility Referral Yes  Mobility visit 1 Mobility  Mobility Specialist Start Time (ACUTE ONLY) 1100  Mobility Specialist Stop Time (ACUTE ONLY) 1116  Mobility Specialist Time Calculation (min) (ACUTE ONLY) 16 min   Received pt in bed having no complaints and agreeable to mobility. Pt was asymptomatic throughout ambulation and returned to room w/o fault. Left in bed w/ call bell in reach and all needs met.  During Mobility: HR 80-90  Inetta Manes Mobility Specialist  Please contact vis Secure Chat or  Rehab Office [phone]

## 2023-09-20 NOTE — Progress Notes (Signed)
 Cardiac Rehab Phase 1  Patient just completed ambulation with Mobility Team. MI education completed with patient and patient's wife including restrictions, CP, NTG use, and calling 911, risk factor modification, and activity progression. MI book, exercise and nutrition handouts reviewed and given. Discussed phase 2 cardiac rehab, and he is interested in the program at [facility]. Cardiac rehab brochure given, and referral entered in EPIC. Patient verbalizes understanding of information given.  Doree Games, MS, ACSM Dayne Even 09/20/2023 [phone]

## 2023-09-20 NOTE — Progress Notes (Signed)
  Progress Note   Patient: Richard Mclaughlin [id number]:[id number] DOB: 1941-04-03 DOA: 09/18/2023     1 DOS: the patient was seen and examined on 09/20/2023        Brief hospital course: 83 y.o. M with HTN, DM, HLD, CKD IIIa baseline 1.3 and OSA on CPAP who presented with several days exertional epigastric pain.  In the ER, ECG showed ST changes, troponin found to be >20K.  Admitted on heparin  gtt for NSTEMI.        Assessment and Plan: * Non-STEMI (non-ST elevated myocardial infarction) (HCC) Admitted on heparin  drip. Cardiology consulted.  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  OMT recommended.  LDL 170 last month Echo 4/18 showed reduced EF, 25%, also RWMA. Normal valves. Creatinine up slightly, within expected for new Entresto  start - Trend BMP - Continue new aspirin , Plavix  - Continue new Lipitor  - Continue new Entresto , Jardiance , Coreg  - Stop lisinopril    Chronic systolic CHF (congestive heart failure) (HCC) Ischemic cardiomyopathy Found to have reduced EF on LHC. Echo shows new EF 25%  - Follow echo - Start Entresto , Coreg , Jardiance  - Stop lisinopril - Trend creatinine on new Entresto   Hyponatremia Mild, asymptomatic - Trend BMP post-cath  Transaminitis Liver imaging shows fatty liver.  Abdominal discomfort is clearly exertional and an anginal equivalent.  No nausea or vomiting. Focal CBD dilation is nonspecific and does not correlate to clinical symptoms. - Trend LFTs after discharge  Hyperlipidemia History of statin intolerance. - Start Lipitor  80 nightly - Check LDL in 2 months and add non-statin agent if >55  OSA (obstructive sleep apnea) - CPAP at night  Non-insulin  dependent type 2 diabetes mellitus (HCC) Glucose controlled.  Diet controlled at baseline. A1c 6.4% - Continue SS correction insulin   Hypertension BP soft - Stop lisinopril - Continue new Coreg , Entresto   CKD stage 3a, GFR 45-59  ml/min (HCC) Cr stable relative to baseline 1.3          Subjective: Patient is feeling well.  He has lots of questions about his cath.  No fever, confusion, chest pain, dyspnea, swelling.     Physical Exam: BP 107/63 (BP Location: Left Arm)   Pulse 76   Temp 98.3 F (36.8 C) (Oral)   Resp 16   Ht 6\' 1"  (1.854 m)   Wt 84.9 kg   SpO2 92%   BMI 24.69 kg/m   Adult male, lying in bed, interactive and appropriate RRR, no murmurs, no peripheral edema Respiratory normal, lungs clear without rales or wheezes Abdomen soft no tenderness palpation or guarding Attention normal, affect normal, judgment insight appear normal   Data Reviewed: Discussed with cardiology Basic metabolic panel shows creatinine slightly up to 1.5 CBC unchanged   Family Communication: Wife at the bedside    Disposition: Status is: Inpatient         Author: Ephriam Hashimoto, MD 09/20/2023 4:51 PM  For on call review [email or web address].

## 2023-09-21 ENCOUNTER — Encounter (HOSPITAL_COMMUNITY): Payer: Self-pay | Admitting: Cardiology

## 2023-09-21 DIAGNOSIS — E7849 Other hyperlipidemia: Secondary | ICD-10-CM | POA: Diagnosis not present

## 2023-09-21 DIAGNOSIS — I213 ST elevation (STEMI) myocardial infarction of unspecified site: Secondary | ICD-10-CM | POA: Diagnosis not present

## 2023-09-21 DIAGNOSIS — I255 Ischemic cardiomyopathy: Secondary | ICD-10-CM | POA: Diagnosis not present

## 2023-09-21 DIAGNOSIS — I214 Non-ST elevation (NSTEMI) myocardial infarction: Secondary | ICD-10-CM | POA: Diagnosis not present

## 2023-09-21 DIAGNOSIS — I2510 Atherosclerotic heart disease of native coronary artery without angina pectoris: Secondary | ICD-10-CM

## 2023-09-21 DIAGNOSIS — N179 Acute kidney failure, unspecified: Secondary | ICD-10-CM | POA: Diagnosis not present

## 2023-09-21 LAB — BASIC METABOLIC PANEL WITH GFR
Anion gap: 13 (ref 5–15)
BUN: 30 mg/dL — ABNORMAL HIGH (ref 8–23)
CO2: 21 mmol/L — ABNORMAL LOW (ref 22–32)
Calcium: 9.1 mg/dL (ref 8.9–10.3)
Chloride: 100 mmol/L (ref 98–111)
Creatinine, Ser: 1.49 mg/dL — ABNORMAL HIGH (ref 0.61–1.24)
GFR, Estimated: 47 mL/min — ABNORMAL LOW (ref >60)
Glucose, Bld: 115 mg/dL — ABNORMAL HIGH (ref 70–99)
Potassium: 3.5 mmol/L (ref 3.5–5.1)
Sodium: 134 mmol/L — ABNORMAL LOW (ref 135–145)

## 2023-09-21 LAB — GLUCOSE, CAPILLARY
Glucose-Capillary: 119 mg/dL — ABNORMAL HIGH (ref 70–99)
Glucose-Capillary: 129 mg/dL — ABNORMAL HIGH (ref 70–99)

## 2023-09-21 MED ORDER — ATORVASTATIN CALCIUM 80 MG PO TABS: 80.0000 mg | ORAL_TABLET | Freq: Every day | ORAL | 3 refills | Status: AC

## 2023-09-21 MED ORDER — CLOPIDOGREL BISULFATE 75 MG PO TABS: 75.0000 mg | ORAL_TABLET | Freq: Every day | ORAL | 3 refills | Status: AC

## 2023-09-21 MED ORDER — SACUBITRIL-VALSARTAN 24-26 MG PO TABS
1.0000 | ORAL_TABLET | Freq: Two times a day (BID) | ORAL | Status: DC
  Administered 2023-09-21: 1 via ORAL
  Filled 2023-09-21: qty 1

## 2023-09-21 MED ORDER — CARVEDILOL 3.125 MG PO TABS: 3.1250 mg | ORAL_TABLET | Freq: Two times a day (BID) | ORAL | 3 refills | Status: DC

## 2023-09-21 MED ORDER — SACUBITRIL-VALSARTAN 24-26 MG PO TABS: 1.0000 | ORAL_TABLET | Freq: Two times a day (BID) | ORAL | 11 refills | Status: AC

## 2023-09-21 MED ORDER — NITROGLYCERIN 0.4 MG SL SUBL: 0.4000 mg | SUBLINGUAL_TABLET | SUBLINGUAL | 12 refills | Status: DC | PRN

## 2023-09-21 NOTE — Discharge Summary (Addendum)
 Physician Discharge Summary   Patient: Richard Mclaughlin MRN: [id number] DOB: [birth year]  Admit date:     09/18/2023  Discharge date: 09/21/23  Discharge Physician: Ephriam Hashimoto   PCP: Addie Holstein    Recommendations at discharge:  Follow up with PCP Addie Holstein in 1 week for NSTEMI, coronary disease and CHF Cole Podraza: Please check BMP and blood pressure and adjust new Entresto  and Coreg  as appropriate (discharge Cr 1.49) Please repeat LFTs in 1 week Please repeat LDL in 2 months  Follow up with Cardiology pending 5/8     Discharge Diagnoses: Principal Problem:   Non-STEMI (non-ST elevated myocardial infarction) (HCC) Active Problems:   CKD stage 3a, GFR 45-59 ml/min (HCC)   Hypertension   Non-insulin  dependent type 2 diabetes mellitus (HCC)   OSA (obstructive sleep apnea)   Hyperlipidemia   Transaminitis   Hyponatremia   Chronic systolic CHF (congestive heart failure) (HCC)   Ischemic cardiomyopathy     Hospital Course: 83 y.o. M with HTN, DM, HLD, CKD IIIa baseline 1.3 and OSA on CPAP who presented with several days exertional epigastric pain.  In the ER, ECG showed ST changes, troponin found to be >20K.  Admitted on heparin  gtt for NSTEMI.         * Non-STEMI (non-ST elevated myocardial infarction) (HCC) Admitted on heparin  drip. Cardiology consulted.  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  Cardiology recommended medical therapy alone.  LDL 170 last month  Echo 4/18 showed reduced EF, 25%, also RWMA. Normal valves.  He was started on Plavix , Lipitor , Entresto , and Coreg .  Creatinine increased to 1.5 so Jardiance  was held at discharge.  Cardiology follow-up was arranged.  Recommend close follow-up BMP and BP with PCP to adjust Entresto  as appropriate   Chronic systolic CHF (congestive heart failure) (HCC) Ischemic cardiomyopathy Echo showed EF 25%.  Started on Entresto , Coreg  and  Jardiance .  Hyponatremia Mild, asymptomatic  Transaminitis Liver imaging shows fatty liver.    Abdominal discomfort was clearly exertional and an anginal equivalent.  No nausea or vomiting. Focal CBD dilation was noted on imaging but is nonspecific and does not correlate to clinical symptoms. - Trend LFTs after discharge and work up if persistent  Hyperlipidemia History of statin intolerance.  Started on Lipitor  80 nightly here. - Check LDL in 2 months and add non-statin agent if >70              The [place]  Controlled Substances Registry was reviewed for this patient prior to discharge.  Consultants: Cardiology  Procedures performed: Left heart Cath Echocardiogram US  abdomen   Disposition: Home Diet recommendation:  Discharge Diet Orders (From admission, onward)     Start     Ordered   09/21/23 0000  Diet - low sodium heart healthy        09/21/23 0937             DISCHARGE MEDICATION: Allergies as of 09/21/2023       Reactions   Azithromycin Other (See Comments)   Hemolytic anemia   Simvastatin    Other reaction(s): Muscle Pain        Medication List     STOP taking these medications    lisinopril 10 MG tablet Commonly known as: ZESTRIL       TAKE these medications    acetaminophen  500 MG tablet Commonly known as: TYLENOL  Take 500-1,000 mg by mouth every 6 (six) hours as needed for  moderate pain (pain score 4-6).   aspirin  EC 81 MG tablet Take 81 mg by mouth daily.   atorvastatin  80 MG tablet Commonly known as: LIPITOR  Take 1 tablet (80 mg total) by mouth daily. Start taking on: September 22, 2023   B-12 PO Take 1 tablet by mouth daily.   carvedilol  3.125 MG tablet Commonly known as: COREG  Take 1 tablet (3.125 mg total) by mouth 2 (two) times daily with a meal.   cetirizine 10 MG tablet Commonly known as: ZYRTEC Take 10 mg by mouth daily.   clopidogrel  75 MG tablet Commonly known as: PLAVIX  Take 1 tablet (75 mg  total) by mouth daily. Start taking on: September 22, 2023   cyanocobalamin 1000 MCG/ML injection Commonly known as: VITAMIN B12 Inject 1,000 mcg into the muscle every 30 (thirty) days.   multivitamin with minerals Tabs tablet Take 1 tablet by mouth daily.   nitroGLYCERIN  0.4 MG SL tablet Commonly known as: NITROSTAT  Place 1 tablet (0.4 mg total) under the tongue every 5 (five) minutes x 3 doses as needed for chest pain.   sacubitril -valsartan  24-26 MG Commonly known as: ENTRESTO  Take 1 tablet by mouth 2 (two) times daily.   VITAMIN D PO Take 1 tablet by mouth daily.   ZINC PO Take 1 tablet by mouth daily as needed (when starting to feel sick.).               Durable Medical Equipment  (From admission, onward)           Start     Ordered   09/20/23 1238  For home use only DME Vest life vest  Once       Comments: For ischemic cardiomyopathy LVEF 25%, late presentation STEMI and multivessel CAD (non-revascularized). Need it for 3 months.   09/20/23 1238            Follow-up Information     Cleaver, Chet Cota, NP Follow up.   Specialty: Cardiology Why: Thursday Oct 09, 2023 Appt at 2:20 PM (25 min) Contact information: [address] [place] [address] [phone]         Arch Beans, PA-C. Schedule an appointment as soon as possible for a visit in 1 week(s).   Specialty: Physician Assistant Contact information: [phone] [place] [address] [phone]                 Discharge Instructions     AMB referral to Phase II Cardiac Rehabilitation   Complete by: As directed    Diagnosis:  STEMI Heart Failure (see criteria below if ordering Phase II)     Heart Failure Type: Chronic Systolic Comment - acute systolic   After initial evaluation and assessments completed: Virtual Based Care may be provided alone or in conjunction with Phase 2 Cardiac Rehab based on patient barriers.: Yes   Intensive  Cardiac Rehabilitation (ICR) [facility] location only OR Traditional Cardiac Rehabilitation (TCR) *If criteria for ICR are not met will enroll in TCR [facility] only): Yes   Amb Referral to Cardiac Rehabilitation   Complete by: As directed    Diagnosis: STEMI   After initial evaluation and assessments completed: Virtual Based Care may be provided alone or in conjunction with Phase 2 Cardiac Rehab based on patient barriers.: Yes   Intensive Cardiac Rehabilitation (ICR) [facility] location only OR Traditional Cardiac Rehabilitation (TCR) *If criteria for ICR are not met will enroll in TCR [facility] only): Yes   Diet -  low sodium heart healthy   Complete by: As directed    Discharge instructions   Complete by: As directed    **IMPORTANT DISCHARGE INSTRUCTIONS**   From Dr. Darlyn Eke: You were admitted for a heart attack This is also known as a "myocardial infarction" (myocardial refers to "heart muscle" and infarction refers to "damage from blocked artery")  You had a heart cath here that showed 2 of the 3 main blood vessels of the heart are blocked.  To prevent blockage of the third blood vessel, you should take aspirin , Plavix  and Lipitor   Because of the blockages, your heart squeeze is weak To protect the heart and allow the squeeze to improve, you should take carvedilol  and Entresto   CONTINUE aspirin  81 mg daily START clopidogrel /Plavix  75 mg daily (Plavix  is a "super aspirin " to prevent platelets from clumping and blocking the arteries)  START atorvastatin /Lipitor  80 mg nightly to reduce cholesterol  (Cholesterol is a main component of fatty plaques in the arteries)  To protect the heart: START carvedilol /Coreg  3.125 mg twice daily START sacubitril -valsartan  24-26 mg twice daily   Important: STOP your old lisinopril.  It is not safe to take Entresto  at the same time as lisinopril.  Entresto  is replacing it.  Go see Addie Holstein within 1 week Call his office tomorrow to schedule that appointment and to  arrange blood work for this Thursday (to check kidney function)  If you notice dizziness, weakness, dizziness with standing, or mild chest pain, call Wynona Hedger Podraza's office  If you have severe worsening chest pain, pass out or can't breathe, return to the ER  You have a cardiology appointment on May 8 with Lawana Pray (see below in the To Do section)  If there are issues picking up any of your medications tomorrow, call the nursing desk here on [address] [phone] and let them know and ask them to message Dr. Darlyn Eke   Increase activity slowly   Complete by: As directed        Discharge Exam: Filed Weights   09/19/23 0600 09/20/23 0412 09/21/23 0405  Weight: 86.3 kg 84.9 kg 84.9 kg    General: Pt is alert, awake, not in acute distress Cardiovascular: RRR, nl S1-S2, no murmurs appreciated.   No LE edema.   Respiratory: Normal respiratory rate and rhythm.  CTAB without rales or wheezes. Abdominal: Abdomen soft and non-tender.  No distension or HSM.   Neuro/Psych: Strength symmetric in upper and lower extremities.  Judgment and insight appear normal.   Condition at discharge: good  The results of significant diagnostics from this hospitalization (including imaging, microbiology, ancillary and laboratory) are listed below for reference.   Imaging Studies: ECHOCARDIOGRAM COMPLETE Result Date: 09/19/2023    ECHOCARDIOGRAM REPORT   Patient Name:   MARKEISE MATHEWS Date of Exam: 09/19/2023 Medical Rec #:  [id number]            Height:       73.0 in Accession #:    [id number]           Weight:       190.3 lb Date of Birth:  06-Jan-1941            BSA:          2.106 m Patient Age:    82 years             BP:           102/62 mmHg Patient Gender: M  HR:           68 bpm. Exam Location:  Inpatient Procedure: 2D Echo, Color Doppler, Cardiac Doppler and Intracardiac            Opacification Agent (Both Spectral and Color Flow Doppler were            utilized during  procedure). Indications:    Myocardial Infarction i21.9  History:        Patient has prior history of Echocardiogram examinations, most                 recent 02/10/2023. CHF, CAD; Risk Factors:Sleep Apnea,                 Hypertension, Diabetes and Dyslipidemia.  Sonographer:    Sherline Distel Senior RDCS Referring Phys: Darline Eis HALEY IMPRESSIONS  1. Poor acoustic windows Definity  used. There is severe hypokinesis/akinesis of the lateral wall, anterior wall, inferior wall. Left ventricular ejection fraction, by estimation, is 25%. The left ventricle has severely decreased function. There is mild left ventricular hypertrophy.  2. Right ventricular systolic function is normal. The right ventricular size is normal.  3. The mitral valve is normal in structure. Trivial mitral valve regurgitation.  4. The aortic valve is tricuspid. Aortic valve regurgitation is not visualized. Aortic valve sclerosis/calcification is present, without any evidence of aortic stenosis.  5. The inferior vena cava is normal in size with greater than 50% respiratory variability, suggesting right atrial pressure of 3 mmHg. FINDINGS  Left Ventricle: Poor acoustic windows Definity  used. There is severe hypokinesis/akinesis of the lateral wall, anterior wall, inferior wall. Left ventricular ejection fraction, by estimation, is 25%. The left ventricle has severely decreased function. Definity  contrast agent was given IV to delineate the left ventricular endocardial borders. The left ventricular internal cavity size was normal in size. There is mild left ventricular hypertrophy. Right Ventricle: The right ventricular size is normal. Right vetricular wall thickness was not assessed. Right ventricular systolic function is normal. Left Atrium: Left atrial size was normal in size. Right Atrium: Right atrial size was normal in size. Pericardium: There is no evidence of pericardial effusion. Mitral Valve: The mitral valve is normal in structure. Trivial mitral valve  regurgitation. Tricuspid Valve: The tricuspid valve is normal in structure. Tricuspid valve regurgitation is trivial. Aortic Valve: The aortic valve is tricuspid. Aortic valve regurgitation is not visualized. Aortic valve sclerosis/calcification is present, without any evidence of aortic stenosis. Pulmonic Valve: The pulmonic valve was normal in structure. Pulmonic valve regurgitation is trivial. Aorta: The aortic root and ascending aorta are structurally normal, with no evidence of dilitation. Venous: The inferior vena cava is normal in size with greater than 50% respiratory variability, suggesting right atrial pressure of 3 mmHg. IAS/Shunts: No atrial level shunt detected by color flow Doppler.  LEFT VENTRICLE PLAX 2D LVIDd:         4.30 cm      Diastology LVIDs:         3.50 cm      LV e' medial:    6.09 cm/s LV PW:         0.80 cm      LV E/e' medial:  9.6 LV IVS:        1.30 cm      LV e' lateral:   4.68 cm/s LVOT diam:     2.00 cm      LV E/e' lateral: 12.5 LV SV:         48  LV SV Index:   23 LVOT Area:     3.14 cm  LV Volumes (MOD) LV vol d, MOD A2C: 86.4 ml LV vol d, MOD A4C: 138.0 ml LV vol s, MOD A2C: 50.3 ml LV vol s, MOD A4C: 77.7 ml LV SV MOD A2C:     36.1 ml LV SV MOD A4C:     138.0 ml LV SV MOD BP:      52.5 ml RIGHT VENTRICLE RV S prime:     9.90 cm/s TAPSE (M-mode): 1.9 cm LEFT ATRIUM             Index        RIGHT ATRIUM           Index LA diam:        2.70 cm 1.28 cm/m   RA Area:     15.80 cm LA Vol (A2C):   51.4 ml 24.40 ml/m  RA Volume:   42.70 ml  20.27 ml/m LA Vol (A4C):   27.6 ml 13.10 ml/m LA Biplane Vol: 39.3 ml 18.66 ml/m  AORTIC VALVE LVOT Vmax:   79.80 cm/s LVOT Vmean:  55.000 cm/s LVOT VTI:    0.152 m  AORTA Ao Root diam: 3.30 cm Ao Asc diam:  4.10 cm MITRAL VALVE MV Area (PHT): 2.55 cm    SHUNTS MV Decel Time: 297 msec    Systemic VTI:  0.15 m MV E velocity: 58.70 cm/s  Systemic Diam: 2.00 cm MV A velocity: 96.40 cm/s MV E/A ratio:  0.61 Ola Berger MD Electronically signed by  Ola Berger MD Signature Date/Time: 09/19/2023/4:29:44 PM    Final    CARDIAC CATHETERIZATION Result Date: 09/19/2023 Images from the original result were not included.   1st Diag lesion is 80% stenosed.   2nd Diag lesion is 20% stenosed. Cardiac Catheterization 09/19/23: Hemodynamic data: LVEDP 18 mmHg.  Mild to moderately elevated.  No pressure gradient across the aortic valve. Angiographic data: LV: Entire inferior and inferoapical akinesis.  LVEF 25 to 30%.  No significant mitral regurgitation. RCA: Occluded in the proximal segment.  Faint collaterals to the PDA from LAD. LM: Large-caliber vessel, normal. LAD: Diffusely diseased and mildly calcified throughout.  Proximal segment has a 80% stenosis.  Moderate-sized D-1 with ostial 80% to 90% stenosis small to moderate-sized D2 with mild 20% ostial stenosis. LCx: Occluded in the proximal segment.  OM1 which appears to be moderate to large size has faint ipsilateral collaterals.  Distal CX not seen. Impression and recommendations: Patient presenting late with inferior and posterior STEMI, essentially living off of LAD now.  LVEF appears to be severely reduced.  Will medically optimize him, started him on Entresto , Jardiance , Coreg  and discontinue metoprolol  tartrate.  Once optimized, if he has any recurrence of angina we will consider PCI to the LAD.   US  Abdomen Limited Result Date: 09/18/2023 CLINICAL DATA:  Epigastric abdominal pain EXAM: ULTRASOUND ABDOMEN LIMITED RIGHT UPPER QUADRANT COMPARISON:  Oct 02, 2015 FINDINGS: Gallbladder: No gallstones or wall thickening visualized. No sonographic Murphy sign noted by sonographer. Common bile duct: Diameter: Suboptimally evaluated. Mildly dilated, partially visualized segment of the common bile duct measuring 8 mm. Liver: No focal lesion identified. Within normal limits in parenchymal echogenicity. Portal vein is patent on color Doppler imaging with normal direction of blood flow towards the liver. Other: None.  IMPRESSION: 1. No cholecystolithiasis or changes of acute cholecystitis. 2. A partially visualized, mildly dilated segment of the common bile duct is noted measuring 8 mm.  This may be within normal limits for patient's given age. However, correlation with serum bilirubin is recommended. Electronically Signed   By: Rance Burrows M.D.   On: 09/18/2023 19:57   DG Chest Port 1 View Result Date: 09/18/2023 CLINICAL DATA:  Epigastric abdominal pain EXAM: PORTABLE CHEST 1 VIEW COMPARISON:  January 23, 2021 FINDINGS: No focal airspace consolidation, pleural effusion, or pneumothorax. No cardiomegaly. Tortuous aorta with aortic atherosclerosis. No acute fracture or destructive lesions. Multilevel thoracic osteophytosis. IMPRESSION: No acute cardiopulmonary abnormality. Electronically Signed   By: Rance Burrows M.D.   On: 09/18/2023 19:54    Microbiology: No results found for this or any previous visit.  Labs: CBC: Recent Labs  Lab 09/18/23 1736 09/19/23 0253 09/20/23 0323  WBC 12.0* 10.6* 8.9  HGB 15.3 14.4 13.9  HCT 43.3 41.0 40.1  MCV 91.9 91.1 93.9  PLT 202 187 169   Basic Metabolic Panel: Recent Labs  Lab 09/18/23 1736 09/19/23 0633 09/20/23 0323 09/21/23 0348  NA 134* 134* 134* 134*  K 3.9 3.7 4.1 3.5  CL 97* 102 101 100  CO2 27 22 20* 21*  GLUCOSE 167* 152* 127* 115*  BUN 18 17 26* 30*  CREATININE 1.34* 1.18 1.51* 1.49*  CALCIUM  10.6* 9.6 9.3 9.1  MG  --  2.0  --   --    Liver Function Tests: Recent Labs  Lab 09/18/23 1736 09/19/23 0633 09/20/23 0323  AST 130* 105* 61*  ALT 32 30 26  ALKPHOS 57 48 43  BILITOT 1.4* 1.4* 1.4*  PROT 7.9 7.1 6.6  ALBUMIN 4.4 3.4* 3.2*   CBG: Recent Labs  Lab 09/20/23 1611 09/20/23 2030 09/20/23 2348 09/21/23 0405 09/21/23 0724  GLUCAP 126* 178* 141* 119* 129*    Discharge time spent: approximately 45 minutes spent on discharge counseling, evaluation of patient on day of discharge, and coordination of discharge planning with  nursing, social work, pharmacy and case management  Signed: Ephriam Hashimoto, MD [facility] 09/21/2023

## 2023-09-21 NOTE — Progress Notes (Signed)
 Reviewed AVS, patient expressed understanding of medications, MD follow up reviewed.   Removed IV, Site clean, dry and intact.  Patient states all belongings brought to the hospital at time of admission are accounted for and packed to take home.  Patient informed and expressed understanding on where to pick up medications  Pt transported to discharge lounge entrance where family member was waiting in vehicle to transport home.

## 2023-09-21 NOTE — TOC Transition Note (Signed)
 Transition of Care [facility]) - Discharge Note   Patient Details  Name: Richard Mclaughlin MRN: [id number] Date of Birth: 07/06/1940  Transition of Care [facility]) CM/SW Contact:  Omie Bickers, RN Phone Number: 09/21/2023, 9:52 AM   Clinical Narrative:     Eldora Greet w patient and wife at bedside. Patient is wearing Life Vest. Provided with Entresto  30 day Free form to give to pharmacist when he picks up his meds         Patient Goals and CMS Choice            Discharge Placement                       Discharge Plan and Services Additional resources added to the After Visit Summary for                                       Social Drivers of Health (SDOH) Interventions SDOH Screenings   Food Insecurity: No Food Insecurity (09/19/2023)  Housing: Low Risk  (09/19/2023)  Transportation Needs: No Transportation Needs (09/19/2023)  Utilities: Not At Risk (09/19/2023)  Social Connections: Moderately Integrated (09/19/2023)  Tobacco Use: Medium Risk (09/19/2023)     Readmission Risk Interventions     No data to display

## 2023-09-21 NOTE — Progress Notes (Addendum)
 Progress Note  Patient Name: Richard Mclaughlin Date of Encounter: 09/21/2023  Primary Cardiologist: None  Subjective   No symptoms.  LifeVest in place.  Inpatient Medications    Scheduled Meds:  aspirin  EC  81 mg Oral Daily   atorvastatin   80 mg Oral Daily   carvedilol   3.125 mg Oral BID WC   clopidogrel   75 mg Oral Daily   insulin  aspart  0-6 Units Subcutaneous Q4H   sacubitril -valsartan   1 tablet Oral BID   Continuous Infusions:  PRN Meds: nitroGLYCERIN    Vital Signs    Vitals:   09/20/23 2031 09/20/23 2349 09/21/23 0405 09/21/23 0700  BP: 120/70 (!) 99/57 113/70 133/74  Pulse: 78 68  77  Resp: 18 17 18 18   Temp: 98.1 F (36.7 C) 98.2 F (36.8 C) 98.5 F (36.9 C) 97.9 F (36.6 C)  TempSrc: Oral Oral Oral Oral  SpO2: 92% 93% 93% 93%  Weight:   84.9 kg   Height:        Intake/Output Summary (Last 24 hours) at 09/21/2023 0833 Last data filed at 09/21/2023 0657 Gross per 24 hour  Intake --  Output 675 ml  Net -675 ml   Filed Weights   09/19/23 0600 09/20/23 0412 09/21/23 0405  Weight: 86.3 kg 84.9 kg 84.9 kg    Telemetry     Personally reviewed.  NSR.  ECG    Not performed today.  Physical Exam   GEN: No acute distress.   Neck: No JVD. Cardiac: RRR, no murmur, rub, or gallop.  Respiratory: Nonlabored. Clear to auscultation bilaterally. GI: Soft, nontender, bowel sounds present. MS: No edema; No deformity. Neuro:  Nonfocal. Psych: Alert and oriented x 3. Normal affect.  Labs    Chemistry Recent Labs  Lab 09/18/23 1736 09/19/23 0633 09/20/23 0323 09/21/23 0348  NA 134* 134* 134* 134*  K 3.9 3.7 4.1 3.5  CL 97* 102 101 100  CO2 27 22 20* 21*  GLUCOSE 167* 152* 127* 115*  BUN 18 17 26* 30*  CREATININE 1.34* 1.18 1.51* 1.49*  CALCIUM  10.6* 9.6 9.3 9.1  PROT 7.9 7.1 6.6  --   ALBUMIN 4.4 3.4* 3.2*  --   AST 130* 105* 61*  --   ALT 32 30 26  --   ALKPHOS 57 48 43  --   BILITOT 1.4* 1.4* 1.4*  --   GFRNONAA 53* >60 46* 47*   ANIONGAP 10 10 13 13      Hematology Recent Labs  Lab 09/18/23 1736 09/19/23 0253 09/20/23 0323  WBC 12.0* 10.6* 8.9  RBC 4.71 4.50 4.27  HGB 15.3 14.4 13.9  HCT 43.3 41.0 40.1  MCV 91.9 91.1 93.9  MCH 32.5 32.0 32.6  MCHC 35.3 35.1 34.7  RDW 12.1 12.2 12.2  PLT 202 187 169    Cardiac Enzymes Recent Labs  Lab 09/18/23 1736 09/18/23 2048 09/19/23 0633  TROPONINIHS 15,946* 20,800* 15,283*    BNPNo results for input(s): "BNP", "PROBNP" in the last 168 hours.   DDimerNo results for input(s): "DDIMER" in the last 168 hours.   Radiology    ECHOCARDIOGRAM COMPLETE Result Date: 09/19/2023    ECHOCARDIOGRAM REPORT   Patient Name:   Richard Mclaughlin Date of Exam: 09/19/2023 Medical Rec #:  [id number]            Height:       73.0 in Accession #:    [id number]           Weight:  190.3 lb Date of Birth:  Jun 03, 1941            BSA:          2.106 m Patient Age:    82 years             BP:           102/62 mmHg Patient Gender: M                    HR:           68 bpm. Exam Location:  Inpatient Procedure: 2D Echo, Color Doppler, Cardiac Doppler and Intracardiac            Opacification Agent (Both Spectral and Color Flow Doppler were            utilized during procedure). Indications:    Myocardial Infarction i21.9  History:        Patient has prior history of Echocardiogram examinations, most                 recent 02/10/2023. CHF, CAD; Risk Factors:Sleep Apnea,                 Hypertension, Diabetes and Dyslipidemia.  Sonographer:    Sherline Distel Senior RDCS Referring Phys: Darline Eis HALEY IMPRESSIONS  1. Poor acoustic windows Definity  used. There is severe hypokinesis/akinesis of the lateral wall, anterior wall, inferior wall. Left ventricular ejection fraction, by estimation, is 25%. The left ventricle has severely decreased function. There is mild left ventricular hypertrophy.  2. Right ventricular systolic function is normal. The right ventricular size is normal.  3. The mitral valve is  normal in structure. Trivial mitral valve regurgitation.  4. The aortic valve is tricuspid. Aortic valve regurgitation is not visualized. Aortic valve sclerosis/calcification is present, without any evidence of aortic stenosis.  5. The inferior vena cava is normal in size with greater than 50% respiratory variability, suggesting right atrial pressure of 3 mmHg. FINDINGS  Left Ventricle: Poor acoustic windows Definity  used. There is severe hypokinesis/akinesis of the lateral wall, anterior wall, inferior wall. Left ventricular ejection fraction, by estimation, is 25%. The left ventricle has severely decreased function. Definity  contrast agent was given IV to delineate the left ventricular endocardial borders. The left ventricular internal cavity size was normal in size. There is mild left ventricular hypertrophy. Right Ventricle: The right ventricular size is normal. Right vetricular wall thickness was not assessed. Right ventricular systolic function is normal. Left Atrium: Left atrial size was normal in size. Right Atrium: Right atrial size was normal in size. Pericardium: There is no evidence of pericardial effusion. Mitral Valve: The mitral valve is normal in structure. Trivial mitral valve regurgitation. Tricuspid Valve: The tricuspid valve is normal in structure. Tricuspid valve regurgitation is trivial. Aortic Valve: The aortic valve is tricuspid. Aortic valve regurgitation is not visualized. Aortic valve sclerosis/calcification is present, without any evidence of aortic stenosis. Pulmonic Valve: The pulmonic valve was normal in structure. Pulmonic valve regurgitation is trivial. Aorta: The aortic root and ascending aorta are structurally normal, with no evidence of dilitation. Venous: The inferior vena cava is normal in size with greater than 50% respiratory variability, suggesting right atrial pressure of 3 mmHg. IAS/Shunts: No atrial level shunt detected by color flow Doppler.  LEFT VENTRICLE PLAX 2D LVIDd:          4.30 cm      Diastology LVIDs:         3.50 cm  LV e' medial:    6.09 cm/s LV PW:         0.80 cm      LV E/e' medial:  9.6 LV IVS:        1.30 cm      LV e' lateral:   4.68 cm/s LVOT diam:     2.00 cm      LV E/e' lateral: 12.5 LV SV:         48 LV SV Index:   23 LVOT Area:     3.14 cm  LV Volumes (MOD) LV vol d, MOD A2C: 86.4 ml LV vol d, MOD A4C: 138.0 ml LV vol s, MOD A2C: 50.3 ml LV vol s, MOD A4C: 77.7 ml LV SV MOD A2C:     36.1 ml LV SV MOD A4C:     138.0 ml LV SV MOD BP:      52.5 ml RIGHT VENTRICLE RV S prime:     9.90 cm/s TAPSE (M-mode): 1.9 cm LEFT ATRIUM             Index        RIGHT ATRIUM           Index LA diam:        2.70 cm 1.28 cm/m   RA Area:     15.80 cm LA Vol (A2C):   51.4 ml 24.40 ml/m  RA Volume:   42.70 ml  20.27 ml/m LA Vol (A4C):   27.6 ml 13.10 ml/m LA Biplane Vol: 39.3 ml 18.66 ml/m  AORTIC VALVE LVOT Vmax:   79.80 cm/s LVOT Vmean:  55.000 cm/s LVOT VTI:    0.152 m  AORTA Ao Root diam: 3.30 cm Ao Asc diam:  4.10 cm MITRAL VALVE MV Area (PHT): 2.55 cm    SHUNTS MV Decel Time: 297 msec    Systemic VTI:  0.15 m MV E velocity: 58.70 cm/s  Systemic Diam: 2.00 cm MV A velocity: 96.40 cm/s MV E/A ratio:  0.61 Ola Berger MD Electronically signed by Ola Berger MD Signature Date/Time: 09/19/2023/4:29:44 PM    Final    CARDIAC CATHETERIZATION Result Date: 09/19/2023 Images from the original result were not included.   1st Diag lesion is 80% stenosed.   2nd Diag lesion is 20% stenosed. Cardiac Catheterization 09/19/23: Hemodynamic data: LVEDP 18 mmHg.  Mild to moderately elevated.  No pressure gradient across the aortic valve. Angiographic data: LV: Entire inferior and inferoapical akinesis.  LVEF 25 to 30%.  No significant mitral regurgitation. RCA: Occluded in the proximal segment.  Faint collaterals to the PDA from LAD. LM: Large-caliber vessel, normal. LAD: Diffusely diseased and mildly calcified throughout.  Proximal segment has a 80% stenosis.  Moderate-sized D-1 with  ostial 80% to 90% stenosis small to moderate-sized D2 with mild 20% ostial stenosis. LCx: Occluded in the proximal segment.  OM1 which appears to be moderate to large size has faint ipsilateral collaterals.  Distal CX not seen. Impression and recommendations: Patient presenting late with inferior and posterior STEMI, essentially living off of LAD now.  LVEF appears to be severely reduced.  Will medically optimize him, started him on Entresto , Jardiance , Coreg  and discontinue metoprolol  tartrate.  Once optimized, if he has any recurrence of angina we will consider PCI to the LAD.    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  EKG showed ST elevations  in inferior leads, lateral leads (V5 and V6), ST depressions in anterior leads, Q waves in inferior leads. Hs troponins elevated, 15,946>>20,800>>15,283.  LHC showed ostial RCA CTO (collaterals to PDA from LAD), proximal LAD 80% stenosis, D1 80 to 90% stenosis, ostial LCx CTO (OM fills by collaterals from LAD). LV gram showed inferior and inferoapical akinesis, LVEF was 25 to 30%.  No PCI was performed due to absence of symptoms and late presentation of STEMI.  Medical management for ischemic cardiomyopathy was recommended.  If he develops any recurrent angina, he will be considered for LAD PCI.  He would need LifeVest due to non-revascularized multivessel critical CAD and ischemic cardiomyopathy with LVEF 25%.  He already has LifeVest in place.  Continue DAPT indefinitely, aspirin  81 mg once daily, Plavix  75 mg once daily.  Ischemic cardiomyopathy with LVEF 25% and no device: Compensated.  Continue GDMT, carvedilol  3.125 mg twice daily, Entresto  24-26 mg twice daily, Jardiance  10 mg once daily.  Outpatient titration GDMT.  LifeVest upon discharge.  He already has LifeVest in place.  AKI: Upon chart review, he is  noted to have mildly elevated serum creatinine, CKD.  Baseline serum creatinine around 1.3.  Creatinine has been stable around 1.5 yesterday, 1.49 today.  Stable creatinine, okay to go home today.  Will continue current GDMT.  Type 2 diabetes mellitus: HbA1c 6.6 in February 2025.  Not on diabetic medications.  Aggressive control DM 2 due to CAD.  OSA on CPAP: Continue CPAP.  HLD, not at goal: LDL 169 in February 2025.  Continue atorvastatin  80 mg nightly.  Goal LDL less than 55.  Disposition: Discharge to home today.  Signed, Lasalle Pointer, MD  09/21/2023, 8:33 AM

## 2023-09-22 LAB — LIPOPROTEIN A (LPA): Lipoprotein (a): 143.2 nmol/L — ABNORMAL HIGH (ref <75.0)

## 2023-09-25 ENCOUNTER — Telehealth (HOSPITAL_COMMUNITY): Payer: Self-pay

## 2023-09-25 NOTE — Telephone Encounter (Signed)
 Pt insurance is active and benefits verified through [facility]. Co-pay $0.00, DED $0.00/$0.00 met, out of pocket $3,900.00/$111.15 met, co-insurance 0%. No pre-authorization required. [employer], 09/25/23 @ 4:24PM, REF#[id number]   How many CR sessions are covered? (36 visits for TCR, 72 visits for ICR)72 Is this a lifetime maximum or an annual maximum? Annual Has the member used any of these services to date? No Is there a time limit (weeks/months) on start of program and/or program completion? No     Will contact patient to see if he is interested in the Cardiac Rehab Program. If interested, patient will need to complete follow up appt. Once completed, patient will be contacted for scheduling upon review by the RN Navigator.

## 2023-10-06 ENCOUNTER — Encounter (HOSPITAL_BASED_OUTPATIENT_CLINIC_OR_DEPARTMENT_OTHER): Payer: Self-pay

## 2023-10-07 NOTE — Progress Notes (Unsigned)
 Cardiology Clinic Note   Patient Name: Richard Mclaughlin Date of Encounter: 10/09/2023  Primary Care Provider:  Fannie Homestead, MD Primary Cardiologist:  None  Patient Profile    Richard Mclaughlin 83 year old male presents to the clinic today for follow-up evaluation of his coronary artery disease and ischemic cardiomyopathy.  Past Medical History    Past Medical History:  Diagnosis Date   CKD stage 3a, GFR 45-59 ml/min (HCC) 09/19/2023   High cholesterol    Hypertension 09/19/2023   Non-insulin  dependent type 2 diabetes mellitus (HCC) 09/19/2023   OSA (obstructive sleep apnea) 09/19/2023   Past Surgical History:  Procedure Laterality Date   LEFT HEART CATH AND CORONARY ANGIOGRAPHY N/A 09/19/2023   Procedure: LEFT HEART CATH AND CORONARY ANGIOGRAPHY;  Surgeon: Knox Perl, MD;  Location: [facility];  Service: Cardiovascular;  Laterality: N/A;    Allergies  Allergies  Allergen Reactions   Azithromycin Other (See Comments)    Hemolytic anemia   Simvastatin     Other reaction(s): Muscle Pain    History of Present Illness    Richard Mclaughlin has a PMH of ischemic cardiomyopathy LVEF 25%, AKI, type 2 diabetes, OSA, hyperlipidemia, and coronary artery disease.  He was a late presentation inferior and posterior STEMI.  He was noted to have multivessel coronary artery disease.  He presented to the hospital on 09/19/2023 and was discharged on 09/21/2023.  He noted epigastric pain, nausea, and his ingestion type symptoms.  His EKG showed ST elevations in inferior leads, lateral leads and ST depression in anterior leads.  His cardiac troponins were elevated at 15,000-20,000 and decreased to 15,283.  He underwent left heart catheterization which showed RCA CTO with collaterals to PDA from LAD, proximal LAD 80%, D1 80-90% stenosis, ostial circumflex chronic total occlusion.  His LV gram showed inferior and inferior apical akinesis.  His LVEF was noted to be 25-30%.  No  PCI was performed due to absence of symptoms and late presentation of STEMI.  Medical management was recommended.  He was given a LifeVest at discharge.  He presents to the clinic today for follow-up evaluation and states he has been slowly increasing his physical activity.  He has been walking in his neighborhood.  He reports that he walks both down and up inclines.  He has been doing well with this.  He does note increased work of breathing with increased physical activity.  His breathing recovers quickly.  His blood pressure today is 100/60.  He is euvolemic.  He is tolerating his medications well.  We reviewed the importance of low-sodium diet.  I will order BMP today.  I will give the salty 6 diet sheet.  I am unable to uptitrate GDMT due to blood pressure.  Will plan follow-up in 1 to 2 months.  Today he denies chest pain, shortness of breath, lower extremity edema, fatigue, palpitations, melena, hematuria, hemoptysis, diaphoresis, weakness, presyncope, syncope, orthopnea, and PND.   Home Medications    Prior to Admission medications   Medication Sig Start Date End Date Taking? Authorizing Provider  acetaminophen  (TYLENOL ) 500 MG tablet Take 500-1,000 mg by mouth every 6 (six) hours as needed for moderate pain (pain score 4-6).    [provider]  aspirin  EC 81 MG tablet Take 81 mg by mouth daily.    [provider]  atorvastatin  (LIPITOR ) 80 MG tablet Take 1 tablet (80 mg total) by mouth daily. 09/22/23   Danford, Willis Harter, MD  carvedilol  (  COREG ) 3.125 MG tablet Take 1 tablet (3.125 mg total) by mouth 2 (two) times daily with a meal. 09/21/23   Danford, Willis Harter, MD  cetirizine (ZYRTEC) 10 MG tablet Take 10 mg by mouth daily.    [provider]  clopidogrel  (PLAVIX ) 75 MG tablet Take 1 tablet (75 mg total) by mouth daily. 09/22/23   Danford, Willis Harter, MD  cyanocobalamin (,VITAMIN B-12,) 1000 MCG/ML injection Inject 1,000 mcg into the muscle every 30  (thirty) days.  Patient not taking: Reported on 09/19/2023    [provider]  Cyanocobalamin (B-12 PO) Take 1 tablet by mouth daily.    [provider]  Multiple Vitamin (MULTIVITAMIN WITH MINERALS) TABS tablet Take 1 tablet by mouth daily.    [provider]  Multiple Vitamins-Minerals (ZINC PO) Take 1 tablet by mouth daily as needed (when starting to feel sick.).    [provider]  nitroGLYCERIN  (NITROSTAT ) 0.4 MG SL tablet Place 1 tablet (0.4 mg total) under the tongue every 5 (five) minutes x 3 doses as needed for chest pain. 09/21/23   Danford, Willis Harter, MD  sacubitril -valsartan  (ENTRESTO ) 24-26 MG Take 1 tablet by mouth 2 (two) times daily. 09/21/23   Danford, Willis Harter, MD  VITAMIN D PO Take 1 tablet by mouth daily.    [provider]    Family History    History reviewed. No pertinent family history. has no family status information on file.   Social History    Social History   Socioeconomic History   Marital status: Married    Spouse name: Not on file   Number of children: Not on file   Years of education: Not on file   Highest education level: Not on file  Occupational History   Not on file  Tobacco Use   Smoking status: Former    Current packs/day: 0.00    Types: Cigarettes    Quit date: 06/04/1983    Years since quitting: 40.3   Smokeless tobacco: Not on file  Substance and Sexual Activity   Alcohol use: Yes   Drug use: No   Sexual activity: Yes  Other Topics Concern   Not on file  Social History Narrative   Not on file   Social Drivers of Health   Financial Resource Strain: Not on file  Food Insecurity: Low Risk  (09/22/2023)   Received from [facility]   Hunger Vital Sign    Worried About Running Out of Food in the Last Year: Never true    Ran Out of Food in the Last Year: Never true  Transportation Needs: No Transportation Needs (09/22/2023)   Received from [facility]    In the  past 12 months, has lack of reliable transportation kept you from medical appointments, meetings, work or from getting things needed for daily living? : No  Physical Activity: Not on file  Stress: Not on file  Social Connections: Moderately Integrated (09/19/2023)   Social Connection and Isolation Panel [NHANES]    Frequency of Communication with Friends and Family: Once a week    Frequency of Social Gatherings with Friends and Family: More than three times a week    Attends Religious Services: 1 to 4 times per year    Active Member of [employer] or Organizations: No    Attends Banker Meetings: Never    Marital Status: Married  Catering manager Violence: Not At Risk (09/19/2023)   Humiliation, Afraid, Rape, and Kick questionnaire  Fear of Current or Ex-Partner: No    Emotionally Abused: No    Physically Abused: No    Sexually Abused: No     Review of Systems    General:  No chills, fever, night sweats or weight changes.  Cardiovascular:  No chest pain, dyspnea on exertion, edema, orthopnea, palpitations, paroxysmal nocturnal dyspnea. Dermatological: No rash, lesions/masses Respiratory: No cough, dyspnea Urologic: No hematuria, dysuria Abdominal:   No nausea, vomiting, diarrhea, bright red blood per rectum, melena, or hematemesis Neurologic:  No visual changes, wkns, changes in mental status. All other systems reviewed and are otherwise negative except as noted above.  Physical Exam    VS:  BP 100/60 (BP Location: Left Arm, Patient Position: Sitting, Cuff Size: Normal)   Pulse 71   Ht 6\' 1"  (1.854 m)   Wt 190 lb (86.2 kg)   SpO2 94%   BMI 25.07 kg/m  , BMI Body mass index is 25.07 kg/m. GEN: Well nourished, well developed, in no acute distress. HEENT: normal. Neck: Supple, no JVD, carotid bruits, or masses. Cardiac: RRR, no murmurs, rubs, or gallops. No clubbing, cyanosis, edema.  Radials/DP/PT 2+ and equal bilaterally.  Respiratory:  Respirations regular and  unlabored, clear to auscultation bilaterally. GI: Soft, nontender, nondistended, BS + x 4. MS: no deformity or atrophy. Skin: warm and dry, no rash. Neuro:  Strength and sensation are intact. Psych: Normal affect.  Accessory Clinical Findings    Recent Labs: 09/19/2023: Magnesium 2.0 09/20/2023: ALT 26; Hemoglobin 13.9; Platelets 169 09/21/2023: BUN 30; Creatinine, Ser 1.49; Potassium 3.5; Sodium 134   Recent Lipid Panel No results found for: "CHOL", "TRIG", "HDL", "CHOLHDL", "VLDL", "LDLCALC", "LDLDIRECT"       ECG personally reviewed by me today- EKG Interpretation Date/Time:  Thursday Oct 09 2023 14:29:53 EDT Ventricular Rate:  71 PR Interval:  176 QRS Duration:  114 QT Interval:  394 QTC Calculation: 428 R Axis:   -2  Text Interpretation: Normal sinus rhythm Inferior infarct (cited on or before 18-Sep-2023) ST & T wave abnormality, consider lateral ischemia Confirmed by Lawana Pray [phone]) on 10/09/2023 2:32:07 PM   Echocardiogram 09/19/2023 IMPRESSIONS     1. Poor acoustic windows Definity  used. There is severe  hypokinesis/akinesis of the lateral wall, anterior wall, inferior wall.  Left ventricular ejection fraction, by estimation, is 25%. The left  ventricle has severely decreased function. There is mild  left ventricular hypertrophy.   2. Right ventricular systolic function is normal. The right ventricular  size is normal.   3. The mitral valve is normal in structure. Trivial mitral valve  regurgitation.   4. The aortic valve is tricuspid. Aortic valve regurgitation is not  visualized. Aortic valve sclerosis/calcification is present, without any  evidence of aortic stenosis.   5. The inferior vena cava is normal in size with greater than 50%  respiratory variability, suggesting right atrial pressure of 3 mmHg.   FINDINGS   Left Ventricle: Poor acoustic windows Definity  used. There is severe  hypokinesis/akinesis of the lateral wall, anterior wall, inferior  wall.  Left ventricular ejection fraction, by estimation, is 25%. The left  ventricle has severely decreased function.  Definity  contrast agent was given IV to delineate the left ventricular  endocardial borders. The left ventricular internal cavity size was normal  in size. There is mild left ventricular hypertrophy.   Right Ventricle: The right ventricular size is normal. Right vetricular  wall thickness was not assessed. Right ventricular systolic function is  normal.   Left  Atrium: Left atrial size was normal in size.   Right [facility]: Right atrial size was normal in size.   Pericardium: There is no evidence of pericardial effusion.   Mitral Valve: The mitral valve is normal in structure. Trivial mitral  valve regurgitation.   Tricuspid Valve: The tricuspid valve is normal in structure. Tricuspid  valve regurgitation is trivial.   Aortic Valve: The aortic valve is tricuspid. Aortic valve regurgitation is  not visualized. Aortic valve sclerosis/calcification is present, without  any evidence of aortic stenosis.   Pulmonic Valve: The pulmonic valve was normal in structure. Pulmonic valve  regurgitation is trivial.   Aorta: The aortic root and ascending aorta are structurally normal, with  no evidence of dilitation.   Venous: The inferior vena cava is normal in size with greater than 50%  respiratory variability, suggesting right atrial pressure of 3 mmHg.   IAS/Shunts: No atrial level shunt detected by color flow Doppler.     Cardiac catheterization 09/19/2023    1st Diag lesion is 80% stenosed.   2nd Diag lesion is 20% stenosed.   Cardiac Catheterization 09/19/23: Hemodynamic data: LVEDP 18 mmHg.  Mild to moderately elevated.  No pressure gradient across the aortic valve.   Angiographic data: LV: Entire inferior and inferoapical akinesis.  LVEF 25 to 30%.  No significant mitral regurgitation. RCA: Occluded in the proximal segment.  Faint collaterals to the PDA from  LAD. LM: Large-caliber vessel, normal. LAD: Diffusely diseased and mildly calcified throughout.  Proximal segment has a 80% stenosis.  Moderate-sized D-1 with ostial 80% to 90% stenosis small to moderate-sized D2 with mild 20% ostial stenosis. LCx: Occluded in the proximal segment.  OM1 which appears to be moderate to large size has faint ipsilateral collaterals.  Distal CX not seen.      Impression and recommendations: Patient presenting late with inferior and posterior STEMI, essentially living off of LAD now.  LVEF appears to be severely reduced.  Will medically optimize him, started him on Entresto , Jardiance , Coreg  and discontinue metoprolol  tartrate.  Once optimized, if he has any recurrence of angina we will consider PCI to the LAD.      Assessment & Plan   1.  Coronary artery disease-no chest pain today.  Was a late presentation STEMI.  He was noted to have multivessel coronary artery disease.  He has collaterals from chronic RCA to PDA from LAD, proximal LAD was 80% stenosed, D1 80-90% stenosed, ostial circumflex chronic total occlusion with collaterals from LAD. Continue aspirin , Plavix   Ischemic cardiomyopathy-weight today 190.  Slowly increasing physical activity.  LVEF noted to be 25-30%.  He is wearing LifeVest.  BP today 100/60.  Unable to obtain trach GDMT due to blood pressure and renal function. Continue carvedilol , Entresto , Jardiance  Heart healthy low-sodium diet Continue to progress with physical activity Repeat BMP   Hyperlipidemia-LDL 116 on 2/25.  Goal LDL less than 55. High-fiber diet Continue atorvastatin   OSA-reports compliance with CPAP.  Waking up well rested. Continue CPAP use  AKI-creatinine 1.49 on 09/21/2023. Follows with PCP  Type 2 diabetes-A1c 6.62/25. Carb modified diet Continue current medical therapy  Disposition: Follow-up with Dr. Mallipeddi or me in 1-2 months.   Chet Cota. Jassen Sarver NP-C     10/09/2023, 3:04 PM [facility] [address] Office [phone] Fax [phone]    I spent 15 minutes examining this patient, reviewing medications, and using patient centered shared decision making involving their cardiac care.   I spent  20 minutes reviewing past medical history,  medications, and prior cardiac tests.

## 2023-10-09 ENCOUNTER — Encounter: Payer: Self-pay | Admitting: General Practice

## 2023-10-09 ENCOUNTER — Ambulatory Visit: Attending: General Practice | Admitting: General Practice

## 2023-10-09 VITALS — BP 100/60 | HR 71 | Ht 73.0 in | Wt 190.0 lb

## 2023-10-09 DIAGNOSIS — I255 Ischemic cardiomyopathy: Secondary | ICD-10-CM

## 2023-10-09 DIAGNOSIS — E782 Mixed hyperlipidemia: Secondary | ICD-10-CM | POA: Diagnosis not present

## 2023-10-09 DIAGNOSIS — I2510 Atherosclerotic heart disease of native coronary artery without angina pectoris: Secondary | ICD-10-CM | POA: Diagnosis not present

## 2023-10-09 DIAGNOSIS — G4733 Obstructive sleep apnea (adult) (pediatric): Secondary | ICD-10-CM | POA: Diagnosis not present

## 2023-10-09 DIAGNOSIS — N179 Acute kidney failure, unspecified: Secondary | ICD-10-CM

## 2023-10-09 NOTE — Addendum Note (Signed)
 Addended by: Herbie Loll on: 10/09/2023 03:14 PM   Modules accepted: Orders

## 2023-10-09 NOTE — Patient Instructions (Signed)
 Medication Instructions:  No medication changes were made during today's visit.   Continue with physical activity.  *If you need a refill on your cardiac medications before your next appointment, please call your pharmacy*   Lab Work: BMET will be drawn today. If you have labs (blood work) drawn today and your tests are completely normal, you will receive your results only by: MyChart Message (if you have MyChart) OR A paper copy in the mail If you have any lab test that is abnormal or we need to change your treatment, we will call you to review the results.   Testing/Procedures: No procedures were ordered during today's visit.    Follow-Up: At [facility], you and your health needs are our priority.  As part of our continuing mission to provide you with exceptional heart care, we have created designated Provider Care Teams.  These Care Teams include your primary Cardiologist (physician) and Advanced Practice Providers (APPs -  Physician Assistants and Nurse Practitioners) who all work together to provide you with the care you need, when you need it.  We recommend signing up for the patient portal called "MyChart".  Sign up information is provided on this After Visit Summary.  MyChart is used to connect with patients for Virtual Visits (Telemedicine).  Patients are able to view lab/test results, encounter notes, upcoming appointments, etc.  Non-urgent messages can be sent to your provider as well.   To learn more about what you can do with MyChart, go to [email or web address].    Your next appointment:   1-44month(s)  Provider:   Lawana Pray   Other Instructions  [facility]:

## 2023-10-10 LAB — BASIC METABOLIC PANEL WITH GFR
BUN/Creatinine Ratio: 15 (ref 10–24)
BUN: 20 mg/dL (ref 8–27)
CO2: 22 mmol/L (ref 20–29)
Calcium: 9.9 mg/dL (ref 8.6–10.2)
Chloride: 103 mmol/L (ref 96–106)
Creatinine, Ser: 1.3 mg/dL — ABNORMAL HIGH (ref 0.76–1.27)
Glucose: 128 mg/dL — ABNORMAL HIGH (ref 70–99)
Potassium: 4.7 mmol/L (ref 3.5–5.2)
Sodium: 140 mmol/L (ref 134–144)
eGFR: 55 mL/min/{1.73_m2} — ABNORMAL LOW (ref >59)

## 2023-10-14 ENCOUNTER — Ambulatory Visit: Payer: Self-pay

## 2023-11-28 NOTE — Progress Notes (Unsigned)
 Cardiology Clinic Note   Patient Name: Richard Mclaughlin Date of Encounter: 12/01/2023  Primary Care Provider:  Mylinda Lenis, MD Primary Cardiologist:  None  Patient Profile    Richard Mclaughlin 83 year old male presents to the clinic today for follow-up evaluation of his coronary artery disease and ischemic cardiomyopathy.  Past Medical History    Past Medical History:  Diagnosis Date   CKD stage 3a, GFR 45-59 ml/min (HCC) 09/19/2023   High cholesterol    Hypertension 09/19/2023   Non-insulin  dependent type 2 diabetes mellitus (HCC) 09/19/2023   OSA (obstructive sleep apnea) 09/19/2023   Past Surgical History:  Procedure Laterality Date   LEFT HEART CATH AND CORONARY ANGIOGRAPHY N/A 09/19/2023   Procedure: LEFT HEART CATH AND CORONARY ANGIOGRAPHY;  Surgeon: [name], MD;  Location: [facility];  Service: Cardiovascular;  Laterality: N/A;    Allergies  Allergies  Allergen Reactions   Azithromycin Other (See Comments)    Hemolytic anemia   Simvastatin     Other reaction(s): Muscle Pain    History of Present Illness    Richard Mclaughlin has a PMH of ischemic cardiomyopathy LVEF 25%, AKI, type 2 diabetes, OSA, hyperlipidemia, and coronary artery disease.  He was a late presentation inferior and posterior STEMI.  He was noted to have multivessel coronary artery disease.  He presented to the hospital on 09/19/2023 and was discharged on 09/21/2023.  He noted epigastric pain, nausea, and his ingestion type symptoms.  His EKG showed ST elevations in inferior leads, lateral leads and ST depression in anterior leads.  His cardiac troponins were elevated at 15,000-20,000 and decreased to 15,283.  He underwent left heart catheterization which showed RCA CTO with collaterals to PDA from LAD, proximal LAD 80%, D1 80-90% stenosis, ostial circumflex chronic total occlusion.  His LV gram showed inferior and inferior apical akinesis.  His LVEF was noted to be 25-30%.  No  PCI was performed due to absence of symptoms and late presentation of STEMI.  Medical management was recommended.  He was given a LifeVest at discharge.  He presented to the clinic 10/09/23 for follow-up evaluation and stated he had been slowly increasing his physical activity.  He had been walking in his neighborhood.  He reported that he walked both down and up inclines.  He had been doing well with this.  He did note increased work of breathing with increased physical activity.  His breathing recovered quickly.  His blood pressure today was 100/60.  He was euvolemic.  He was tolerating his medications well.  We reviewed the importance of low-sodium diet.   l ordered a BMP.    I was unable to uptitrate GDMT due to blood pressure.  Follow-up in 1-2 months was planned.  His BMP showed stable electrolytes and improved renal function.  He presents to the clinic today for follow-up evaluation and states he has been having trouble with the heat.  We reviewed his previous clinic visit and his medications.  We reviewed his decreased EF.  He and his wife expressed understanding.  We reviewed recommendations for not exercising and temperatures over 80 or less than 32.  He had recent lab work drawn with his PCP.  We reviewed this.  His LDL cholesterol decreased to 85.  I will add ezetimibe.  His blood pressure today is 114/60.  His pulses 62.  I am unable to uptitrate GDMT at this time.  We will plan for repeat echocardiogram in 1 month and  plan follow-up in 3 to 4 months..   Today he denies chest pain, shortness of breath, lower extremity edema, fatigue, palpitations, melena, hematuria, hemoptysis, diaphoresis, weakness, presyncope, syncope, orthopnea, and PND.   Home Medications    Prior to Admission medications   Medication Sig Start Date End Date Taking? Authorizing Provider  acetaminophen  (TYLENOL ) 500 MG tablet Take 500-1,000 mg by mouth every 6 (six) hours as needed for moderate pain (pain score 4-6).     [provider]  aspirin  EC 81 MG tablet Take 81 mg by mouth daily.    [provider]  atorvastatin  (LIPITOR ) 80 MG tablet Take 1 tablet (80 mg total) by mouth daily. 09/22/23   Danford, Lonni SQUIBB, MD  carvedilol  (COREG ) 3.125 MG tablet Take 1 tablet (3.125 mg total) by mouth 2 (two) times daily with a meal. 09/21/23   Danford, Lonni SQUIBB, MD  cetirizine (ZYRTEC) 10 MG tablet Take 10 mg by mouth daily.    [provider]  clopidogrel  (PLAVIX ) 75 MG tablet Take 1 tablet (75 mg total) by mouth daily. 09/22/23   Danford, Lonni SQUIBB, MD  cyanocobalamin (,VITAMIN B-12,) 1000 MCG/ML injection Inject 1,000 mcg into the muscle every 30 (thirty) days.  Patient not taking: Reported on 09/19/2023    [provider]  Cyanocobalamin (B-12 PO) Take 1 tablet by mouth daily.    [provider]  Multiple Vitamin (MULTIVITAMIN WITH MINERALS) TABS tablet Take 1 tablet by mouth daily.    [provider]  Multiple Vitamins-Minerals (ZINC PO) Take 1 tablet by mouth daily as needed (when starting to feel sick.).    [provider]  nitroGLYCERIN  (NITROSTAT ) 0.4 MG SL tablet Place 1 tablet (0.4 mg total) under the tongue every 5 (five) minutes x 3 doses as needed for chest pain. 09/21/23   Danford, Lonni SQUIBB, MD  sacubitril -valsartan  (ENTRESTO ) 24-26 MG Take 1 tablet by mouth 2 (two) times daily. 09/21/23   Danford, Lonni SQUIBB, MD  VITAMIN D PO Take 1 tablet by mouth daily.    [provider]    Family History    History reviewed. No pertinent family history. has no family status information on file.   Social History    Social History   Socioeconomic History   Marital status: Married    Spouse name: Not on file   Number of children: Not on file   Years of education: Not on file   Highest education level: Not on file  Occupational History   Not on file  Tobacco Use   Smoking status: Former    Current packs/day: 0.00     Types: Cigarettes    Quit date: 06/04/1983    Years since quitting: 40.5   Smokeless tobacco: Not on file  Substance and Sexual Activity   Alcohol use: Yes   Drug use: No   Sexual activity: Yes  Other Topics Concern   Not on file  Social History Narrative   Not on file   Social Drivers of Health   Financial Resource Strain: Not on file  Food Insecurity: Low Risk  (11/18/2023)   Received from [facility]   Hunger Vital Sign    Within the past 12 months, you worried that your food would run out before you got money to buy more: Never true    Within the past 12 months, the food you bought just didn't last and you didn't have money to get more. : Never true  Transportation Needs: No Transportation Needs (  11/18/2023)   Received from [facility]    In the past 12 months, has lack of reliable transportation kept you from medical appointments, meetings, work or from getting things needed for daily living? : No  Physical Activity: Not on file  Stress: Not on file  Social Connections: Moderately Integrated (09/19/2023)   Social Connection and Isolation Panel    Frequency of Communication with Friends and Family: Once a week    Frequency of Social Gatherings with Friends and Family: More than three times a week    Attends Religious Services: 1 to 4 times per year    Active Member of [employer] or Organizations: No    Attends Banker Meetings: Never    Marital Status: Married  Catering manager Violence: Not At Risk (09/19/2023)   Humiliation, Afraid, Rape, and Kick questionnaire    Fear of Current or Ex-Partner: No    Emotionally Abused: No    Physically Abused: No    Sexually Abused: No     Review of Systems    General:  No chills, fever, night sweats or weight changes.  Cardiovascular:  No chest pain, dyspnea on exertion, edema, orthopnea, palpitations, paroxysmal nocturnal dyspnea. Dermatological: No rash, lesions/masses Respiratory: No cough,  dyspnea Urologic: No hematuria, dysuria Abdominal:   No nausea, vomiting, diarrhea, bright red blood per rectum, melena, or hematemesis Neurologic:  No visual changes, wkns, changes in mental status. All other systems reviewed and are otherwise negative except as noted above.  Physical Exam    VS:  BP 114/60 (BP Location: Left Arm, Patient Position: Sitting, Cuff Size: Large)   Pulse 62   Ht 6' 1 (1.854 m)   Wt 192 lb 9.6 oz (87.4 kg)   BMI 25.41 kg/m  , BMI Body mass index is 25.41 kg/m. GEN: Well nourished, well developed, in no acute distress. HEENT: normal. Neck: Supple, no JVD, carotid bruits, or masses. Cardiac: RRR, no murmurs, rubs, or gallops. No clubbing, cyanosis, edema.  Radials/DP/PT 2+ and equal bilaterally.  Respiratory:  Respirations regular and unlabored, clear to auscultation bilaterally. GI: Soft, nontender, nondistended, BS + x 4. MS: no deformity or atrophy. Skin: warm and dry, no rash. Neuro:  Strength and sensation are intact. Psych: Normal affect.  Accessory Clinical Findings    Recent Labs: 09/19/2023: Magnesium 2.0 09/20/2023: ALT 26; Hemoglobin 13.9; Platelets 169 10/09/2023: BUN 20; Creatinine, Ser 1.30; Potassium 4.7; Sodium 140   Recent Lipid Panel No results found for: CHOL, TRIG, HDL, CHOLHDL, VLDL, LDLCALC, LDLDIRECT       ECG personally reviewed by me today-     Echocardiogram 09/19/2023 IMPRESSIONS     1. Poor acoustic windows Definity  used. There is severe  hypokinesis/akinesis of the lateral wall, anterior wall, inferior wall.  Left ventricular ejection fraction, by estimation, is 25%. The left  ventricle has severely decreased function. There is mild  left ventricular hypertrophy.   2. Right ventricular systolic function is normal. The right ventricular  size is normal.   3. The mitral valve is normal in structure. Trivial mitral valve  regurgitation.   4. The aortic valve is tricuspid. Aortic valve regurgitation is  not  visualized. Aortic valve sclerosis/calcification is present, without any  evidence of aortic stenosis.   5. The inferior vena cava is normal in size with greater than 50%  respiratory variability, suggesting right atrial pressure of 3 mmHg.   FINDINGS   Left Ventricle: Poor acoustic windows Definity  used. There is severe  hypokinesis/akinesis of the lateral wall, anterior wall, inferior wall.  Left ventricular ejection fraction, by estimation, is 25%. The left  ventricle has severely decreased function.  Definity  contrast agent was given IV to delineate the left ventricular  endocardial borders. The left ventricular internal cavity size was normal  in size. There is mild left ventricular hypertrophy.   Right Ventricle: The right ventricular size is normal. Right vetricular  wall thickness was not assessed. Right ventricular systolic function is  normal.   Left [facility]: Left atrial size was normal in size.   Right [facility]: Right atrial size was normal in size.   Pericardium: There is no evidence of pericardial effusion.   Mitral Valve: The mitral valve is normal in structure. Trivial mitral  valve regurgitation.   Tricuspid Valve: The tricuspid valve is normal in structure. Tricuspid  valve regurgitation is trivial.   Aortic Valve: The aortic valve is tricuspid. Aortic valve regurgitation is  not visualized. Aortic valve sclerosis/calcification is present, without  any evidence of aortic stenosis.   Pulmonic Valve: The pulmonic valve was normal in structure. Pulmonic valve  regurgitation is trivial.   Aorta: The aortic root and ascending aorta are structurally normal, with  no evidence of dilitation.   Venous: The inferior vena cava is normal in size with greater than 50%  respiratory variability, suggesting right atrial pressure of 3 mmHg.   IAS/Shunts: No atrial level shunt detected by color flow Doppler.     Cardiac catheterization 09/19/2023    1st Diag lesion  is 80% stenosed.   2nd Diag lesion is 20% stenosed.   Cardiac Catheterization 09/19/23: Hemodynamic data: LVEDP 18 mmHg.  Mild to moderately elevated.  No pressure gradient across the aortic valve.   Angiographic data: LV: Entire inferior and inferoapical akinesis.  LVEF 25 to 30%.  No significant mitral regurgitation. RCA: Occluded in the proximal segment.  Faint collaterals to the PDA from LAD. LM: Large-caliber vessel, normal. LAD: Diffusely diseased and mildly calcified throughout.  Proximal segment has a 80% stenosis.  Moderate-sized D-1 with ostial 80% to 90% stenosis small to moderate-sized D2 with mild 20% ostial stenosis. LCx: Occluded in the proximal segment.  OM1 which appears to be moderate to large size has faint ipsilateral collaterals.  Distal CX not seen.      Impression and recommendations: Patient presenting late with inferior and posterior STEMI, essentially living off of LAD now.  LVEF appears to be severely reduced.  Will medically optimize him, started him on Entresto , Jardiance , Coreg  and discontinue metoprolol  tartrate.  Once optimized, if he has any recurrence of angina we will consider PCI to the LAD.      Assessment & Plan   1.  Ischemic cardiomyopathy-weight today 192 lbs.  Continues to increase physical activity.  LVEF noted to be 25-30%.  He is wearing LifeVest.  BP today 100/60.  Unable to obtain trach GDMT due to blood pressure and renal function.  Plan for repeat echocardiogram once GDMT has been optimized x 1 month.  Unable to uptitrate GDMT at this time due to blood pressure and renal function.  BP today 114/60 Continue carvedilol , Entresto , Jardiance  Heart healthy low-sodium diet Continue to progress with physical activity Plan for repeat echocardiogram at the end of July  Coronary artery disease-denies anginal type symptoms.  He was a late presentation STEMI 09/19/2023.  He was noted to have multivessel coronary artery disease.  He has collaterals  from chronic RCA to PDA from LAD, proximal LAD was 80% stenosed,  D1 80-90% stenosed, ostial circumflex chronic total occlusion with collaterals from LAD. Continue aspirin , Plavix  Heart healthy low-sodium high-fiber diet Increase physical activity as tolerated  Hyperlipidemia-LDL 116 on 2/25.   85 on 11/18/23. Goal LDL less than 55.  High-fiber diet Continue atorvastatin  Add zetia  OSA-continues to be compliant with CPAP.  Weight loss Avoid supine sleeping Continue CPAP use  AKI-creatinine 1.49 on 09/21/2023.  Follow-up lab work 10/09/2023 showed creatinine of 1.30 Follows with PCP  Type 2 diabetes-A1c 6.62/25.  Glucose 128 on 10/29/2023 Carb modified diet-reviewed Following with PCP  Disposition: Follow-up with Dr. Mallipeddi or me in 3-4 months.   Richard HERO. Glynis Hunsucker NP-C     12/01/2023, 2:00 PM  [facility] [address] Office [phone] Fax [phone]    I spent 14 minutes examining this patient, reviewing medications, and using patient centered shared decision making involving their cardiac care.   I spent  20 minutes reviewing past medical history,  medications, and prior cardiac tests.

## 2023-12-01 ENCOUNTER — Encounter: Payer: Self-pay | Admitting: General Practice

## 2023-12-01 ENCOUNTER — Ambulatory Visit: Attending: General Practice | Admitting: General Practice

## 2023-12-01 VITALS — BP 114/60 | HR 62 | Ht 73.0 in | Wt 192.6 lb

## 2023-12-01 DIAGNOSIS — I2510 Atherosclerotic heart disease of native coronary artery without angina pectoris: Secondary | ICD-10-CM | POA: Diagnosis not present

## 2023-12-01 DIAGNOSIS — G4733 Obstructive sleep apnea (adult) (pediatric): Secondary | ICD-10-CM

## 2023-12-01 DIAGNOSIS — E782 Mixed hyperlipidemia: Secondary | ICD-10-CM

## 2023-12-01 DIAGNOSIS — I255 Ischemic cardiomyopathy: Secondary | ICD-10-CM | POA: Diagnosis not present

## 2023-12-01 DIAGNOSIS — N179 Acute kidney failure, unspecified: Secondary | ICD-10-CM

## 2023-12-01 MED ORDER — EZETIMIBE 10 MG PO TABS: 10.0000 mg | ORAL_TABLET | Freq: Every day | ORAL | 3 refills | Status: DC

## 2023-12-01 NOTE — Patient Instructions (Signed)
 Medication Instructions:  START Ezetimibe (Zetia) 10 mg once daily   *If you need a refill on your cardiac medications before your next appointment, please call your pharmacy*  Lab Work: None ordered today. If you have labs (blood work) drawn today and your tests are completely normal, you will receive your results only by: MyChart Message (if you have MyChart) OR A paper copy in the mail If you have any lab test that is abnormal or we need to change your treatment, we will call you to review the results.  Testing/Procedures: Your physician has requested that you have an echocardiogram to be completed in 1 month. Echocardiography is a painless test that uses sound waves to create images of your heart. It provides your doctor with information about the size and shape of your heart and how well your heart's chambers and valves are working. This procedure takes approximately one hour. There are no restrictions for this procedure. Please do NOT wear cologne, perfume, aftershave, or lotions (deodorant is allowed). Please arrive 15 minutes prior to your appointment time.  Please note: We ask at that you not bring children with you during ultrasound (echo/ vascular) testing. Due to room size and safety concerns, children are not allowed in the ultrasound rooms during exams. Our front office staff cannot provide observation of children in our lobby area while testing is being conducted. An adult accompanying a patient to their appointment will only be allowed in the ultrasound room at the discretion of the ultrasound technician under special circumstances. We apologize for any inconvenience.   Follow-Up: At [facility], you and your health needs are our priority.  As part of our continuing mission to provide you with exceptional heart care, our providers are all part of one team.  This team includes your primary Cardiologist (physician) and Advanced Practice Providers or APPs (Physician  Assistants and Nurse Practitioners) who all work together to provide you with the care you need, when you need it.  Your next appointment:   3-4 month(s)  Provider:   Vishnu Mallipeddi, MD or Josefa Beauvais, NP

## 2023-12-09 ENCOUNTER — Ambulatory Visit: Payer: Self-pay | Admitting: General Practice

## 2023-12-09 ENCOUNTER — Other Ambulatory Visit (HOSPITAL_BASED_OUTPATIENT_CLINIC_OR_DEPARTMENT_OTHER)

## 2023-12-09 DIAGNOSIS — I255 Ischemic cardiomyopathy: Secondary | ICD-10-CM

## 2023-12-09 DIAGNOSIS — I5022 Chronic systolic (congestive) heart failure: Secondary | ICD-10-CM

## 2023-12-09 DIAGNOSIS — I77819 Aortic ectasia, unspecified site: Secondary | ICD-10-CM

## 2023-12-09 NOTE — Telephone Encounter (Signed)
 The patient has been notified of the result and verbalized understanding.  All questions (if any) were answered.  Pt aware we will order for him to get a repeat echo done in one year for surveillance.   Pt aware our Echo Scheduler will call him closer to that time frame to arrange that appt.   Pt verbalized understanding and agrees with this plan.

## 2023-12-09 NOTE — Telephone Encounter (Signed)
-----   Message from Josefa CHRISTELLA Beauvais sent at 12/09/2023  2:46 PM EDT ----- Please contact Mr. Uselman and let him know that his echocardiogram has been reviewed.  His pumping function was normal.  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  No mitral valve  regurgitation or stenosis were noted.  His aortic valve was noted to be severely calcified.  No aortic valve narrowing was noted.  His ascending aorta was noted to be 42 mm in the aortic root was  noted to be 41 mm.  We will plan for repeat echocardiogram in 12 months.  Thank you. ----- Message ----- From: Interface, [employer] Sent: 12/09/2023   2:25 PM EDT To: Josefa CHRISTELLA Beauvais, NP

## 2023-12-12 ENCOUNTER — Telehealth: Payer: Self-pay | Admitting: General Practice

## 2023-12-12 NOTE — Telephone Encounter (Signed)
 Spoke with the patient and advised that per Josefa Beauvais, NP he could stop wearing his life vest. Patient verbalized understanding.

## 2023-12-12 NOTE — Telephone Encounter (Signed)
 Pt received notice that his Life Vest Shara is expiring and needs to know if it needs to be re-auth or does he still need to wear it. Please advise

## 2023-12-12 NOTE — Telephone Encounter (Signed)
 Patient states that he received a notification from [employer] that his life vest was expiring and that he would need a new prescription. He would like to know if he even needs to keep wearing it. Patient hospitalized in 09/2023 for STEMI and underwent heart cath with severe stenosis. No PCI performed. He was sent home with a life vest.  Patient was last seen by Josefa Beauvais, NP and had a repeat echo a couple days ago showing pumping function had normalized.

## 2023-12-14 LAB — ECHOCARDIOGRAM COMPLETE
Area-P 1/2: 3.08 cm2
S' Lateral: 3.18 cm

## 2023-12-15 NOTE — Telephone Encounter (Signed)
 Covering for Richard Beauvais, NP.  Updated echo report with LVEF 45-50% (mildly reduced heart pumping function). Per reader, endocardium not well visualized making assessment of LVEF difficult.   Recommend repeat limited echocardiogram with definity .   [name] S [name], NP

## 2023-12-16 ENCOUNTER — Encounter (HOSPITAL_COMMUNITY): Payer: Self-pay

## 2023-12-16 NOTE — Addendum Note (Signed)
 Addended by: [place]a Hedman L on: 12/16/2023 01:59 PM   Modules accepted: Orders

## 2023-12-30 ENCOUNTER — Encounter (HOSPITAL_COMMUNITY): Payer: Self-pay

## 2023-12-30 ENCOUNTER — Encounter (HOSPITAL_COMMUNITY)
Admission: RE | Admit: 2023-12-30 | Discharge: 2023-12-30 | Disposition: A | Discharge status: Home / Self Care | Source: Ambulatory Visit | Attending: Cardiology | Admitting: Cardiology

## 2023-12-30 VITALS — BP 96/58 | HR 69 | Ht 73.0 in | Wt 190.5 lb

## 2023-12-30 DIAGNOSIS — I255 Ischemic cardiomyopathy: Secondary | ICD-10-CM | POA: Insufficient documentation

## 2023-12-30 DIAGNOSIS — I2111 ST elevation (STEMI) myocardial infarction involving right coronary artery: Secondary | ICD-10-CM | POA: Insufficient documentation

## 2023-12-30 NOTE — Progress Notes (Signed)
 Cardiac Rehab Medication Review by a Nurse   Does the patient  feel that his/her medications are working for him/her?  yes   Has the patient been experiencing any side effects to the medications prescribed?  no   Does the patient measure his/her own blood pressure or blood glucose at home?  yes    Does the patient have any problems obtaining medications due to transportation or finances?   no   Understanding of regimen: good Understanding of indications: good Potential of compliance: excellent     Pt measures blood pressure at home and has no questions regarding medications at this time.

## 2023-12-30 NOTE — Progress Notes (Signed)
 Cardiac Individual Treatment Plan  Patient Details  Name: HARBERT FITTERER MRN: [id number] Date of Birth: 1940-11-27 Referring Provider:   Flowsheet Row INTENSIVE CARDIAC REHAB ORIENT from 12/30/2023 in [facility]  Referring Provider [facility]    Initial Encounter Date:  Flowsheet Row INTENSIVE CARDIAC REHAB ORIENT from 12/30/2023 in [facility]  Date 12/30/23    Visit Diagnosis: 09/19/23 STEMI Late presenting. Medical Therapy  Ischemic cardiomyopathy  Patient's Home Medications on Admission:  Current Outpatient Medications:    acetaminophen  (TYLENOL ) 500 MG tablet, Take 500-1,000 mg by mouth every 6 (six) hours as needed for moderate pain (pain score 4-6)., Disp: , Rfl:    aspirin  EC 81 MG tablet, Take 81 mg by mouth daily., Disp: , Rfl:    atorvastatin  (LIPITOR ) 80 MG tablet, Take 1 tablet (80 mg total) by mouth daily., Disp: 90 tablet, Rfl: 3   carvedilol  (COREG ) 3.125 MG tablet, Take 1 tablet (3.125 mg total) by mouth 2 (two) times daily with a meal., Disp: 180 tablet, Rfl: 3   cetirizine (ZYRTEC) 10 MG tablet, Take 10 mg by mouth daily., Disp: , Rfl:    clopidogrel  (PLAVIX ) 75 MG tablet, Take 1 tablet (75 mg total) by mouth daily., Disp: 90 tablet, Rfl: 3   Cyanocobalamin (B-12 PO), Take 1 tablet by mouth daily., Disp: , Rfl:    ezetimibe  (ZETIA ) 10 MG tablet, Take 1 tablet (10 mg total) by mouth daily., Disp: 90 tablet, Rfl: 3   Multiple Vitamin (MULTIVITAMIN WITH MINERALS) TABS tablet, Take 1 tablet by mouth daily., Disp: , Rfl:    Multiple Vitamins-Minerals (ZINC PO), Take 1 tablet by mouth daily as needed (when starting to feel sick.)., Disp: , Rfl:    nitroGLYCERIN  (NITROSTAT ) 0.4 MG SL tablet, Place 1 tablet (0.4 mg total) under the tongue every 5 (five) minutes x 3 doses as needed for chest pain., Disp: 10 tablet, Rfl: 12   sacubitril -valsartan  (ENTRESTO )  24-26 MG, Take 1 tablet by mouth 2 (two) times daily., Disp: 60 tablet, Rfl: 11   VITAMIN D PO, Take 1 tablet by mouth daily., Disp: , Rfl:    cyanocobalamin (,VITAMIN B-12,) 1000 MCG/ML injection, Inject 1,000 mcg into the muscle every 30 (thirty) days. (Patient not taking: Reported on 12/01/2023), Disp: , Rfl:   Past Medical History: Past Medical History:  Diagnosis Date   CKD stage 3a, GFR 45-59 ml/min (HCC) 09/19/2023   High cholesterol    Hypertension 09/19/2023   Non-insulin  dependent type 2 diabetes mellitus (HCC) 09/19/2023   OSA (obstructive sleep apnea) 09/19/2023    Tobacco Use: Social History   Tobacco Use  Smoking Status Former   Current packs/day: 0.00   Types: Cigarettes   Quit date: 06/04/1983   Years since quitting: 40.6  Smokeless Tobacco Not on file    Labs: Review Flowsheet       Latest Ref Rng & Units 09/19/2023  Labs for ITP Cardiac and Pulmonary Rehab  Hemoglobin A1c 4.8 - 5.6 % 6.4     Capillary Blood Glucose: Lab Results  Component Value Date   GLUCAP 129 (H) 09/21/2023   GLUCAP 119 (H) 09/21/2023   GLUCAP 141 (H) 09/20/2023   GLUCAP 178 (H) 09/20/2023   GLUCAP 126 (H) 09/20/2023     Exercise Target Goals: Exercise Program Goal: Individual exercise prescription set using results from initial 6 min walk test and THRR while considering  patient's  activity barriers and safety.   Exercise Prescription Goal: Initial exercise prescription builds to 30-45 minutes a day of aerobic activity, 2-3 days per week.  Home exercise guidelines will be given to patient during program as part of exercise prescription that the participant will acknowledge.  Activity Barriers & Risk Stratification:  Activity Barriers & Cardiac Risk Stratification - 12/30/23 1449       Activity Barriers & Cardiac Risk Stratification   Activity Barriers Balance Concerns;Joint Problems;Deconditioning;Arthritis;Decreased Ventricular Function    Cardiac Risk Stratification High           6 Minute Walk:  6 Minute Walk     Row Name 12/30/23 1430         6 Minute Walk   Phase Initial     Distance 1315 feet     Walk Time 6 minutes     # of Rest Breaks 0     MPH 2.5     METS 2.3     RPE 11     Perceived Dyspnea  0     VO2 Peak 7.8     Symptoms No     Resting HR 69 bpm     Resting BP 96/68     Resting Oxygen Saturation  96 %     Exercise Oxygen Saturation  during 6 min walk 96 %     Max Ex. HR 84 bpm     Max Ex. BP 108/66     2 Minute Post BP 100/60        Oxygen Initial Assessment:   Oxygen Re-Evaluation:   Oxygen Discharge (Final Oxygen Re-Evaluation):   Initial Exercise Prescription:  Initial Exercise Prescription - 12/30/23 1500       Date of Initial Exercise RX and Referring Provider   Date 12/30/23    Referring Provider Ganji    Expected Discharge Date 03/24/24      Bike   Level 1    Watts 50    Minutes 15    METs 2      Track   Minutes 15    METs 2      Prescription Details   Frequency (times per week) 3    Duration Progress to 30 minutes of continuous aerobic without signs/symptoms of physical distress      Intensity   THRR 40-80% of Max Heartrate 55-110    Ratings of Perceived Exertion 11-13    Perceived Dyspnea 0-4      Progression   Progression Continue to progress workloads to maintain intensity without signs/symptoms of physical distress.      Resistance Training   Training Prescription Yes    Weight 3 lbs    Reps 10-15          Perform Capillary Blood Glucose checks as needed.  Exercise Prescription Changes:   Exercise Comments:   Exercise Goals and Review:   Exercise Goals     Row Name 12/30/23 1449             Exercise Goals   Increase Physical Activity Yes       Intervention Provide advice, education, support and counseling about physical activity/exercise needs.;Develop an individualized exercise prescription for aerobic and resistive training based on initial evaluation  findings, risk stratification, comorbidities and participant's personal goals.       Expected Outcomes Short Term: Attend rehab on a regular basis to increase amount of physical activity.;Long Term: Exercising regularly at least 3-5 days a week.;Long Term: Add in home  exercise to make exercise part of routine and to increase amount of physical activity.       Increase Strength and Stamina Yes       Intervention Provide advice, education, support and counseling about physical activity/exercise needs.;Develop an individualized exercise prescription for aerobic and resistive training based on initial evaluation findings, risk stratification, comorbidities and participant's personal goals.       Expected Outcomes Short Term: Increase workloads from initial exercise prescription for resistance, speed, and METs.;Short Term: Perform resistance training exercises routinely during rehab and add in resistance training at home;Long Term: Improve cardiorespiratory fitness, muscular endurance and strength as measured by increased METs and functional capacity ( )       Able to understand and use rate of perceived exertion (RPE) scale Yes       Intervention Provide education and explanation on how to use RPE scale       Expected Outcomes Short Term: Able to use RPE daily in rehab to express subjective intensity level;Long Term:  Able to use RPE to guide intensity level when exercising independently       Knowledge and understanding of Target Heart Rate Range (THRR) Yes       Intervention Provide education and explanation of THRR including how the numbers were predicted and where they are located for reference       Expected Outcomes Long Term: Able to use THRR to govern intensity when exercising independently;Short Term: Able to state/look up THRR;Short Term: Able to use daily as guideline for intensity in rehab       Understanding of Exercise Prescription Yes       Intervention Provide education, explanation, and  written materials on patient's individual exercise prescription       Expected Outcomes Short Term: Able to explain program exercise prescription;Long Term: Able to explain home exercise prescription to exercise independently          Exercise Goals Re-Evaluation :   Discharge Exercise Prescription (Final Exercise Prescription Changes):   Nutrition:  Target Goals: Understanding of nutrition guidelines, daily intake of sodium 1500mg , cholesterol 200mg , calories 30% from fat and 7% or less from saturated fats, daily to have 5 or more servings of fruits and vegetables.  Biometrics:  Pre Biometrics - 12/30/23 1345       Pre Biometrics   Waist Circumference 39.5 inches    Hip Circumference 42 inches    Waist to Hip Ratio 0.94 %    Triceps Skinfold 13 mm    % Body Fat 25.8 %    Grip Strength 38 kg    Flexibility 0 in   COULD NOT REACH   Single Leg Stand 7.18 seconds           Nutrition Therapy Plan and Nutrition Goals:   Nutrition Assessments:  MEDIFICTS Score Key: >=70 Need to make dietary changes  40-70 Heart Healthy Diet <= 40 Therapeutic Level Cholesterol Diet    Picture Your Plate Scores: <59 Unhealthy dietary pattern with much room for improvement. 41-50 Dietary pattern unlikely to meet recommendations for good health and room for improvement. 51-60 More healthful dietary pattern, with some room for improvement.  >60 Healthy dietary pattern, although there may be some specific behaviors that could be improved.    Nutrition Goals Re-Evaluation:   Nutrition Goals Re-Evaluation:   Nutrition Goals Discharge (Final Nutrition Goals Re-Evaluation):   Psychosocial: Target Goals: Acknowledge presence or absence of significant depression and/or stress, maximize coping skills, provide positive support system. Participant is  able to verbalize types and ability to use techniques and skills needed for reducing stress and depression.  Initial Review & Psychosocial  Screening:  Initial Psych Review & Screening - 12/30/23 1533       Initial Review   Current issues with None Identified      Family Dynamics   Good Support System? Yes   Pt has good support system with his wife     Barriers   Psychosocial barriers to participate in program There are no identifiable barriers or psychosocial needs.      Screening Interventions   Interventions Encouraged to exercise    Expected Outcomes Long Term goal: The participant improves quality of Life and PHQ9 Scores as seen by post scores and/or verbalization of changes          Quality of Life Scores:  Quality of Life - 12/30/23 1455       Quality of Life   Select Quality of Life      Quality of Life Scores   Health/Function Pre 28 %    Socioeconomic Pre 30 %    Psych/Spiritual Pre 30 %    Family Post 28.8 %    GLOBAL Pre 29.31 %         Scores of 19 and below usually indicate a poorer quality of life in these areas.  A difference of  2-3 points is a clinically meaningful difference.  A difference of 2-3 points in the total score of the Quality of Life Index has been associated with significant improvement in overall quality of life, self-image, physical symptoms, and general health in studies assessing change in quality of life.  PHQ-9: Review Flowsheet       12/30/2023  Depression screen PHQ 2/9  Decreased Interest 0  Down, Depressed, Hopeless 0  PHQ - 2 Score 0  Altered sleeping 0  Tired, decreased energy 0  Change in appetite 0  Feeling bad or failure about yourself  0  Trouble concentrating 0  Moving slowly or fidgety/restless 0  Suicidal thoughts 0  PHQ-9 Score 0   Interpretation of Total Score  Total Score Depression Severity:  1-4 = Minimal depression, 5-9 = Mild depression, 10-14 = Moderate depression, 15-19 = Moderately severe depression, 20-27 = Severe depression   Psychosocial Evaluation and Intervention:   Psychosocial Re-Evaluation:   Psychosocial Discharge  (Final Psychosocial Re-Evaluation):   Vocational Rehabilitation: Provide vocational rehab assistance to qualifying candidates.   Vocational Rehab Evaluation & Intervention:  Vocational Rehab - 12/30/23 1455       Initial Vocational Rehab Evaluation & Intervention   Assessment shows need for Vocational Rehabilitation No   Pt is retired         Education: Education Goals: Education classes will be provided on a weekly basis, covering required topics. Participant will state understanding/return demonstration of topics presented.     Core Videos: Exercise    Move It!  Clinical staff conducted group or individual video education with verbal and written material and guidebook.  Patient learns the recommended Pritikin exercise program. Exercise with the goal of living a long, healthy life. Some of the health benefits of exercise include controlled diabetes, healthier blood pressure levels, improved cholesterol levels, improved heart and lung capacity, improved sleep, and better body composition. Everyone should speak with their doctor before starting or changing an exercise routine.  Biomechanical Limitations Clinical staff conducted group or individual video education with verbal and written material and guidebook.  Patient learns how biomechanical limitations  can impact exercise and how we can mitigate and possibly overcome limitations to have an impactful and balanced exercise routine.  Body Composition Clinical staff conducted group or individual video education with verbal and written material and guidebook.  Patient learns that body composition (ratio of muscle mass to fat mass) is a key component to assessing overall fitness, rather than body weight alone. Increased fat mass, especially visceral belly fat, can put us  at increased risk for metabolic syndrome, type 2 diabetes, heart disease, and even death. It is recommended to combine diet and exercise (cardiovascular and resistance  training) to improve your body composition. Seek guidance from your physician and exercise physiologist before implementing an exercise routine.  Exercise Action Plan Clinical staff conducted group or individual video education with verbal and written material and guidebook.  Patient learns the recommended strategies to achieve and enjoy long-term exercise adherence, including variety, self-motivation, self-efficacy, and positive decision making. Benefits of exercise include fitness, good health, weight management, more energy, better sleep, less stress, and overall well-being.  Medical   Heart Disease Risk Reduction Clinical staff conducted group or individual video education with verbal and written material and guidebook.  Patient learns our heart is our most vital organ as it circulates oxygen, nutrients, white blood cells, and hormones throughout the entire body, and carries waste away. Data supports a plant-based eating plan like the Pritikin Program for its effectiveness in slowing progression of and reversing heart disease. The video provides a number of recommendations to address heart disease.   Metabolic Syndrome and Belly Fat  Clinical staff conducted group or individual video education with verbal and written material and guidebook.  Patient learns what metabolic syndrome is, how it leads to heart disease, and how one can reverse it and keep it from coming back. You have metabolic syndrome if you have 3 of the following 5 criteria: abdominal obesity, high blood pressure, high triglycerides, low HDL cholesterol, and high blood sugar.  Hypertension and Heart Disease Clinical staff conducted group or individual video education with verbal and written material and guidebook.  Patient learns that high blood pressure, or hypertension, is very common in the [place] . Hypertension is largely due to excessive salt intake, but other important risk factors include being overweight, physical  inactivity, drinking too much alcohol, smoking, and not eating enough potassium from fruits and vegetables. High blood pressure is a leading risk factor for heart attack, stroke, congestive heart failure, dementia, kidney failure, and premature death. Long-term effects of excessive salt intake include stiffening of the arteries and thickening of heart muscle and organ damage. Recommendations include ways to reduce hypertension and the risk of heart disease.  Diseases of Our Time - Focusing on Diabetes Clinical staff conducted group or individual video education with verbal and written material and guidebook.  Patient learns why the best way to stop diseases of our time is prevention, through food and other lifestyle changes. Medicine (such as prescription pills and surgeries) is often only a Band-Aid on the problem, not a long-term solution. Most common diseases of our time include obesity, type 2 diabetes, hypertension, heart disease, and cancer. The Pritikin Program is recommended and has been proven to help reduce, reverse, and/or prevent the damaging effects of metabolic syndrome.  Nutrition   Overview of the Pritikin Eating Plan  Clinical staff conducted group or individual video education with verbal and written material and guidebook.  Patient learns about the Pritikin Eating Plan for disease risk reduction. The Pritikin Eating Plan emphasizes  a wide variety of unrefined, minimally-processed carbohydrates, like fruits, vegetables, whole grains, and legumes. Go, Caution, and Stop food choices are explained. Plant-based and lean animal proteins are emphasized. Rationale provided for low sodium intake for blood pressure control, low added sugars for blood sugar stabilization, and low added fats and oils for coronary artery disease risk reduction and weight management.  Calorie Density  Clinical staff conducted group or individual video education with verbal and written material and guidebook.   Patient learns about calorie density and how it impacts the Pritikin Eating Plan. Knowing the characteristics of the food you choose will help you decide whether those foods will lead to weight gain or weight loss, and whether you want to consume more or less of them. Weight loss is usually a side effect of the Pritikin Eating Plan because of its focus on low calorie-dense foods.  Label Reading  Clinical staff conducted group or individual video education with verbal and written material and guidebook.  Patient learns about the Pritikin recommended label reading guidelines and corresponding recommendations regarding calorie density, added sugars, sodium content, and whole grains.  Dining Out - Part 1  Clinical staff conducted group or individual video education with verbal and written material and guidebook.  Patient learns that restaurant meals can be sabotaging because they can be so high in calories, fat, sodium, and/or sugar. Patient learns recommended strategies on how to positively address this and avoid unhealthy pitfalls.  Facts on Fats  Clinical staff conducted group or individual video education with verbal and written material and guidebook.  Patient learns that lifestyle modifications can be just as effective, if not more so, as many medications for lowering your risk of heart disease. A Pritikin lifestyle can help to reduce your risk of inflammation and atherosclerosis (cholesterol build-up, or plaque, in the artery walls). Lifestyle interventions such as dietary choices and physical activity address the cause of atherosclerosis. A review of the types of fats and their impact on blood cholesterol levels, along with dietary recommendations to reduce fat intake is also included.  Nutrition Action Plan  Clinical staff conducted group or individual video education with verbal and written material and guidebook.  Patient learns how to incorporate Pritikin recommendations into their  lifestyle. Recommendations include planning and keeping personal health goals in mind as an important part of their success.  Healthy Mind-Set    Healthy Minds, Bodies, Hearts  Clinical staff conducted group or individual video education with verbal and written material and guidebook.  Patient learns how to identify when they are stressed. Video will discuss the impact of that stress, as well as the many benefits of stress management. Patient will also be introduced to stress management techniques. The way we think, act, and feel has an impact on our hearts.  How Our Thoughts Can Heal Our Hearts  Clinical staff conducted group or individual video education with verbal and written material and guidebook.  Patient learns that negative thoughts can cause depression and anxiety. This can result in negative lifestyle behavior and serious health problems. Cognitive behavioral therapy is an effective method to help control our thoughts in order to change and improve our emotional outlook.  Additional Videos:  Exercise    Improving Performance  Clinical staff conducted group or individual video education with verbal and written material and guidebook.  Patient learns to use a non-linear approach by alternating intensity levels and lengths of time spent exercising to help burn more calories and lose more body fat. Cardiovascular exercise  helps improve heart health, metabolism, hormonal balance, blood sugar control, and recovery from fatigue. Resistance training improves strength, endurance, balance, coordination, reaction time, metabolism, and muscle mass. Flexibility exercise improves circulation, posture, and balance. Seek guidance from your physician and exercise physiologist before implementing an exercise routine and learn your capabilities and proper form for all exercise.  Introduction to Yoga  Clinical staff conducted group or individual video education with verbal and written material and  guidebook.  Patient learns about yoga, a discipline of the coming together of mind, breath, and body. The benefits of yoga include improved flexibility, improved range of motion, better posture and core strength, increased lung function, weight loss, and positive self-image. Yoga's heart health benefits include lowered blood pressure, healthier heart rate, decreased cholesterol and triglyceride levels, improved immune function, and reduced stress. Seek guidance from your physician and exercise physiologist before implementing an exercise routine and learn your capabilities and proper form for all exercise.  Medical   Aging: Enhancing Your Quality of Life  Clinical staff conducted group or individual video education with verbal and written material and guidebook.  Patient learns key strategies and recommendations to stay in good physical health and enhance quality of life, such as prevention strategies, having an advocate, securing a Health Care Proxy and Power of Attorney, and keeping a list of medications and system for tracking them. It also discusses how to avoid risk for bone loss.  Biology of Weight Control  Clinical staff conducted group or individual video education with verbal and written material and guidebook.  Patient learns that weight gain occurs because we consume more calories than we burn (eating more, moving less). Even if your body weight is normal, you may have higher ratios of fat compared to muscle mass. Too much body fat puts you at increased risk for cardiovascular disease, heart attack, stroke, type 2 diabetes, and obesity-related cancers. In addition to exercise, following the Pritikin Eating Plan can help reduce your risk.  Decoding Lab Results  Clinical staff conducted group or individual video education with verbal and written material and guidebook.  Patient learns that lab test reflects one measurement whose values change over time and are influenced by many factors,  including medication, stress, sleep, exercise, food, hydration, pre-existing medical conditions, and more. It is recommended to use the knowledge from this video to become more involved with your lab results and evaluate your numbers to speak with your doctor.   Diseases of Our Time - Overview  Clinical staff conducted group or individual video education with verbal and written material and guidebook.  Patient learns that according to the CDC, 50% to 70% of chronic diseases (such as obesity, type 2 diabetes, elevated lipids, hypertension, and heart disease) are avoidable through lifestyle improvements including healthier food choices, listening to satiety cues, and increased physical activity.  Sleep Disorders Clinical staff conducted group or individual video education with verbal and written material and guidebook.  Patient learns how good quality and duration of sleep are important to overall health and well-being. Patient also learns about sleep disorders and how they impact health along with recommendations to address them, including discussing with a physician.  Nutrition  Dining Out - Part 2 Clinical staff conducted group or individual video education with verbal and written material and guidebook.  Patient learns how to plan ahead and communicate in order to maximize their dining experience in a healthy and nutritious manner. Included are recommended food choices based on the type of restaurant the patient is  visiting.   Fueling a Banker conducted group or individual video education with verbal and written material and guidebook.  There is a strong connection between our food choices and our health. Diseases like obesity and type 2 diabetes are very prevalent and are in large-part due to lifestyle choices. The Pritikin Eating Plan provides plenty of food and hunger-curbing satisfaction. It is easy to follow, affordable, and helps reduce health risks.  Menu Workshop   Clinical staff conducted group or individual video education with verbal and written material and guidebook.  Patient learns that restaurant meals can sabotage health goals because they are often packed with calories, fat, sodium, and sugar. Recommendations include strategies to plan ahead and to communicate with the manager, chef, or server to help order a healthier meal.  Planning Your Eating Strategy  Clinical staff conducted group or individual video education with verbal and written material and guidebook.  Patient learns about the Pritikin Eating Plan and its benefit of reducing the risk of disease. The Pritikin Eating Plan does not focus on calories. Instead, it emphasizes high-quality, nutrient-rich foods. By knowing the characteristics of the foods, we choose, we can determine their calorie density and make informed decisions.  Targeting Your Nutrition Priorities  Clinical staff conducted group or individual video education with verbal and written material and guidebook.  Patient learns that lifestyle habits have a tremendous impact on disease risk and progression. This video provides eating and physical activity recommendations based on your personal health goals, such as reducing LDL cholesterol, losing weight, preventing or controlling type 2 diabetes, and reducing high blood pressure.  Vitamins and Minerals  Clinical staff conducted group or individual video education with verbal and written material and guidebook.  Patient learns different ways to obtain key vitamins and minerals, including through a recommended healthy diet. It is important to discuss all supplements you take with your doctor.   Healthy Mind-Set    Smoking Cessation  Clinical staff conducted group or individual video education with verbal and written material and guidebook.  Patient learns that cigarette smoking and tobacco addiction pose a serious health risk which affects millions of people. Stopping smoking  will significantly reduce the risk of heart disease, lung disease, and many forms of cancer. Recommended strategies for quitting are covered, including working with your doctor to develop a successful plan.  Culinary   Becoming a Set designer conducted group or individual video education with verbal and written material and guidebook.  Patient learns that cooking at home can be healthy, cost-effective, quick, and puts them in control. Keys to cooking healthy recipes will include looking at your recipe, assessing your equipment needs, planning ahead, making it simple, choosing cost-effective seasonal ingredients, and limiting the use of added fats, salts, and sugars.  Cooking - Breakfast and Snacks  Clinical staff conducted group or individual video education with verbal and written material and guidebook.  Patient learns how important breakfast is to satiety and nutrition through the entire day. Recommendations include key foods to eat during breakfast to help stabilize blood sugar levels and to prevent overeating at meals later in the day. Planning ahead is also a key component.  Cooking - Educational psychologist conducted group or individual video education with verbal and written material and guidebook.  Patient learns eating strategies to improve overall health, including an approach to cook more at home. Recommendations include thinking of animal protein as a side on your plate rather  than center stage and focusing instead on lower calorie dense options like vegetables, fruits, whole grains, and plant-based proteins, such as beans. Making sauces in large quantities to freeze for later and leaving the skin on your vegetables are also recommended to maximize your experience.  Cooking - Healthy Salads and Dressing Clinical staff conducted group or individual video education with verbal and written material and guidebook.  Patient learns that vegetables, fruits, whole  grains, and legumes are the foundations of the Pritikin Eating Plan. Recommendations include how to incorporate each of these in flavorful and healthy salads, and how to create homemade salad dressings. Proper handling of ingredients is also covered. Cooking - Soups and [employer] - Soups and Desserts Clinical staff conducted group or individual video education with verbal and written material and guidebook.  Patient learns that Pritikin soups and desserts make for easy, nutritious, and delicious snacks and meal components that are low in sodium, fat, sugar, and calorie density, while high in vitamins, minerals, and filling fiber. Recommendations include simple and healthy ideas for soups and desserts.   Overview     The Pritikin Solution Program Overview Clinical staff conducted group or individual video education with verbal and written material and guidebook.  Patient learns that the results of the Pritikin Program have been documented in more than 100 articles published in peer-reviewed journals, and the benefits include reducing risk factors for (and, in some cases, even reversing) high cholesterol, high blood pressure, type 2 diabetes, obesity, and more! An overview of the three key pillars of the Pritikin Program will be covered: eating well, doing regular exercise, and having a healthy mind-set.  WORKSHOPS  Exercise: Exercise Basics: Building Your Action Plan Clinical staff led group instruction and group discussion with PowerPoint presentation and patient guidebook. To enhance the learning environment the use of posters, models and videos may be added. At the conclusion of this workshop, patients will comprehend the difference between physical activity and exercise, as well as the benefits of incorporating both, into their routine. Patients will understand the FITT (Frequency, Intensity, Time, and Type) principle and how to use it to build an exercise action plan. In addition,  safety concerns and other considerations for exercise and cardiac rehab will be addressed by the presenter. The purpose of this lesson is to promote a comprehensive and effective weekly exercise routine in order to improve patients' overall level of fitness.   Managing Heart Disease: Your Path to a Healthier Heart Clinical staff led group instruction and group discussion with PowerPoint presentation and patient guidebook. To enhance the learning environment the use of posters, models and videos may be added.At the conclusion of this workshop, patients will understand the anatomy and physiology of the heart. Additionally, they will understand how Pritikin's three pillars impact the risk factors, the progression, and the management of heart disease.  The purpose of this lesson is to provide a high-level overview of the heart, heart disease, and how the Pritikin lifestyle positively impacts risk factors.  Exercise Biomechanics Clinical staff led group instruction and group discussion with PowerPoint presentation and patient guidebook. To enhance the learning environment the use of posters, models and videos may be added. Patients will learn how the structural parts of their bodies function and how these functions impact their daily activities, movement, and exercise. Patients will learn how to promote a neutral spine, learn how to manage pain, and identify ways to improve their physical movement in order to promote healthy living. The purpose of  this lesson is to expose patients to common physical limitations that impact physical activity. Participants will learn practical ways to adapt and manage aches and pains, and to minimize their effect on regular exercise. Patients will learn how to maintain good posture while sitting, walking, and lifting.  Balance Training and Fall Prevention  Clinical staff led group instruction and group discussion with PowerPoint presentation and patient guidebook. To  enhance the learning environment the use of posters, models and videos may be added. At the conclusion of this workshop, patients will understand the importance of their sensorimotor skills (vision, proprioception, and the vestibular system) in maintaining their ability to balance as they age. Patients will apply a variety of balancing exercises that are appropriate for their current level of function. Patients will understand the common causes for poor balance, possible solutions to these problems, and ways to modify their physical environment in order to minimize their fall risk. The purpose of this lesson is to teach patients about the importance of maintaining balance as they age and ways to minimize their risk of falling.  WORKSHOPS   Nutrition:  Fueling a Ship broker led group instruction and group discussion with PowerPoint presentation and patient guidebook. To enhance the learning environment the use of posters, models and videos may be added. Patients will review the foundational principles of the Pritikin Eating Plan and understand what constitutes a serving size in each of the food groups. Patients will also learn Pritikin-friendly foods that are better choices when away from home and review make-ahead meal and snack options. Calorie density will be reviewed and applied to three nutrition priorities: weight maintenance, weight loss, and weight gain. The purpose of this lesson is to reinforce (in a group setting) the key concepts around what patients are recommended to eat and how to apply these guidelines when away from home by planning and selecting Pritikin-friendly options. Patients will understand how calorie density may be adjusted for different weight management goals.  Mindful Eating  Clinical staff led group instruction and group discussion with PowerPoint presentation and patient guidebook. To enhance the learning environment the use of posters, models and videos may  be added. Patients will briefly review the concepts of the Pritikin Eating Plan and the importance of low-calorie dense foods. The concept of mindful eating will be introduced as well as the importance of paying attention to internal hunger signals. Triggers for non-hunger eating and techniques for dealing with triggers will be explored. The purpose of this lesson is to provide patients with the opportunity to review the basic principles of the Pritikin Eating Plan, discuss the value of eating mindfully and how to measure internal cues of hunger and fullness using the Hunger Scale. Patients will also discuss reasons for non-hunger eating and learn strategies to use for controlling emotional eating.  Targeting Your Nutrition Priorities Clinical staff led group instruction and group discussion with PowerPoint presentation and patient guidebook. To enhance the learning environment the use of posters, models and videos may be added. Patients will learn how to determine their genetic susceptibility to disease by reviewing their family history. Patients will gain insight into the importance of diet as part of an overall healthy lifestyle in mitigating the impact of genetics and other environmental insults. The purpose of this lesson is to provide patients with the opportunity to assess their personal nutrition priorities by looking at their family history, their own health history and current risk factors. Patients will also be able to discuss ways of  prioritizing and modifying the Pritikin Eating Plan for their highest risk areas  Menu  Clinical staff led group instruction and group discussion with PowerPoint presentation and patient guidebook. To enhance the learning environment the use of posters, models and videos may be added. Using menus brought in from [employer], or printed from [employer], patients will apply the Pritikin dining out guidelines that were presented in the [employer] video. Patients will also be able to practice these guidelines in a variety of provided scenarios. The purpose of this lesson is to provide patients with the opportunity to practice hands-on learning of the Pritikin Dining Out guidelines with actual menus and practice scenarios.  Label Reading Clinical staff led group instruction and group discussion with PowerPoint presentation and patient guidebook. To enhance the learning environment the use of posters, models and videos may be added. Patients will review and discuss the Pritikin label reading guidelines presented in Pritikin's Label Reading Educational series video. Using fool labels brought in from local grocery stores and markets, patients will apply the label reading guidelines and determine if the packaged food meet the Pritikin guidelines. The purpose of this lesson is to provide patients with the opportunity to review, discuss, and practice hands-on learning of the Pritikin Label Reading guidelines with actual packaged food labels. Cooking School  Pritikin's [employer] are designed to teach patients ways to prepare quick, simple, and affordable recipes at home. The importance of nutrition's role in chronic disease risk reduction is reflected in its emphasis in the overall Pritikin program. By learning how to prepare essential core Pritikin Eating Plan recipes, patients will increase control over what they eat; be able to customize the flavor of foods without the use of added salt, sugar, or fat; and improve the quality of the food they consume. By learning a set of core recipes which are easily assembled, quickly prepared, and affordable, patients are more likely to prepare more healthy foods at home. These workshops focus on convenient breakfasts, simple entres, side dishes, and desserts which can be prepared with minimal effort and are consistent with nutrition recommendations for cardiovascular risk reduction. Cooking  [employer] are taught by a Armed forces logistics/support/administrative officer (RD) who has been trained by the [employer]. The chef or RD has a clear understanding of the importance of minimizing - if not completely eliminating - added fat, sugar, and sodium in recipes. Throughout the series of Cooking School Workshop sessions, patients will learn about healthy ingredients and efficient methods of cooking to build confidence in their capability to prepare    Cooking School weekly topics:  Adding Flavor- Sodium-Free  Fast and Healthy Breakfasts  Powerhouse Plant-Based Proteins  Satisfying Salads and Dressings  Simple Sides and Sauces  International Cuisine-Spotlight on the [employer] Zones  Delicious Desserts  Savory Soups  [employer] - Meals in a Astronomer Appetizers and Snacks  Comforting Weekend Breakfasts  One-Pot Wonders   Fast Evening Meals  Landscape architect Your Pritikin Plate  WORKSHOPS   Healthy Mindset (Psychosocial):  Focused Goals, Sustainable Changes Clinical staff led group instruction and group discussion with PowerPoint presentation and patient guidebook. To enhance the learning environment the use of posters, models and videos may be added. Patients will be able to apply effective goal setting strategies to establish at least one personal goal, and then take consistent, meaningful action toward that goal. They will learn to identify common barriers to achieving personal goals and develop  strategies to overcome them. Patients will also gain an understanding of how our mind-set can impact our ability to achieve goals and the importance of cultivating a positive and growth-oriented mind-set. The purpose of this lesson is to provide patients with a deeper understanding of how to set and achieve personal goals, as well as the tools and strategies needed to overcome common obstacles which may arise along the way.  From Head to Heart: The Power of a Healthy  Outlook  Clinical staff led group instruction and group discussion with PowerPoint presentation and patient guidebook. To enhance the learning environment the use of posters, models and videos may be added. Patients will be able to recognize and describe the impact of emotions and mood on physical health. They will discover the importance of self-care and explore self-care practices which may work for them. Patients will also learn how to utilize the 4 C's to cultivate a healthier outlook and better manage stress and challenges. The purpose of this lesson is to demonstrate to patients how a healthy outlook is an essential part of maintaining good health, especially as they continue their cardiac rehab journey.  Healthy Sleep for a Healthy Heart Clinical staff led group instruction and group discussion with PowerPoint presentation and patient guidebook. To enhance the learning environment the use of posters, models and videos may be added. At the conclusion of this workshop, patients will be able to demonstrate knowledge of the importance of sleep to overall health, well-being, and quality of life. They will understand the symptoms of, and treatments for, common sleep disorders. Patients will also be able to identify daytime and nighttime behaviors which impact sleep, and they will be able to apply these tools to help manage sleep-related challenges. The purpose of this lesson is to provide patients with a general overview of sleep and outline the importance of quality sleep. Patients will learn about a few of the most common sleep disorders. Patients will also be introduced to the concept of "sleep hygiene," and discover ways to self-manage certain sleeping problems through simple daily behavior changes. Finally, the workshop will motivate patients by clarifying the links between quality sleep and their goals of heart-healthy living.   Recognizing and Reducing Stress Clinical staff led group instruction and  group discussion with PowerPoint presentation and patient guidebook. To enhance the learning environment the use of posters, models and videos may be added. At the conclusion of this workshop, patients will be able to understand the types of stress reactions, differentiate between acute and chronic stress, and recognize the impact that chronic stress has on their health. They will also be able to apply different coping mechanisms, such as reframing negative self-talk. Patients will have the opportunity to practice a variety of stress management techniques, such as deep abdominal breathing, progressive muscle relaxation, and/or guided imagery.  The purpose of this lesson is to educate patients on the role of stress in their lives and to provide healthy techniques for coping with it.  Learning Barriers/Preferences:  Learning Barriers/Preferences - 12/30/23 1543       Learning Barriers/Preferences   Learning Barriers Sight   wears glasses   Learning Preferences Skilled Demonstration;Video;Pictoral;Individual Instruction;Group Instruction;Computer/Internet          Education Topics:  Knowledge Questionnaire Score:  Knowledge Questionnaire Score - 12/30/23 1543       Knowledge Questionnaire Score   Pre Score 22/24          Core Components/Risk Factors/Patient Goals at Admission:  Personal Goals and  Risk Factors at Admission - 12/30/23 1452       Core Components/Risk Factors/Patient Goals on Admission    Weight Management Weight Maintenance    Heart Failure Yes    Intervention Provide a combined exercise and nutrition program that is supplemented with education, support and counseling about heart failure. Directed toward relieving symptoms such as shortness of breath, decreased exercise tolerance, and extremity edema.    Expected Outcomes Improve functional capacity of life;Short term: Attendance in program 2-3 days a week with increased exercise capacity. Reported lower sodium intake.  Reported increased fruit and vegetable intake. Reports medication compliance.;Short term: Daily weights obtained and reported for increase. Utilizing diuretic protocols set by physician.;Long term: Adoption of self-care skills and reduction of barriers for early signs and symptoms recognition and intervention leading to self-care maintenance.    Hypertension Yes    Intervention Provide education on lifestyle modifcations including regular physical activity/exercise, weight management, moderate sodium restriction and increased consumption of fresh fruit, vegetables, and low fat dairy, alcohol moderation, and smoking cessation.;Monitor prescription use compliance.    Expected Outcomes Short Term: Continued assessment and intervention until BP is < 140/60mm HG in hypertensive participants. < 130/60mm HG in hypertensive participants with diabetes, heart failure or chronic kidney disease.;Long Term: Maintenance of blood pressure at goal levels.    Lipids Yes    Intervention Provide education and support for participant on nutrition & aerobic/resistive exercise along with prescribed medications to achieve LDL 70mg , HDL >40mg .    Expected Outcomes Short Term: Participant states understanding of desired cholesterol values and is compliant with medications prescribed. Participant is following exercise prescription and nutrition guidelines.;Long Term: Cholesterol controlled with medications as prescribed, with individualized exercise RX and with personalized nutrition plan. Value goals: LDL < 70mg , HDL > 40 mg.          Core Components/Risk Factors/Patient Goals Review:    Core Components/Risk Factors/Patient Goals at Discharge (Final Review):    ITP Comments:  ITP Comments     Row Name 12/30/23 1415           ITP Comments Wilbert Bihari, MD: Medical Director. Introduction to the Pritikin Education Program/Intensive Cardiac Rehab. Initial orientation packet reviewed with the patient.           Comments: Participant attended orientation for the cardiac rehabilitation program on  12/30/2023  to perform initial intake and exercise walk test. Patient introduced to the Pritikin Program education and orientation packet was reviewed. Completed 6-minute walk test, measurements, initial ITP, and exercise prescription. Vital signs stable. Telemetry-normal sinus rhythm with occasional PVC, asymptomatic. Service time was from 1320 to 1530.

## 2024-01-05 ENCOUNTER — Encounter (HOSPITAL_COMMUNITY)
Admission: RE | Admit: 2024-01-05 | Discharge: 2024-01-05 | Disposition: A | Discharge status: Home / Self Care | Source: Ambulatory Visit | Attending: Cardiology | Admitting: Cardiology

## 2024-01-05 DIAGNOSIS — I2111 ST elevation (STEMI) myocardial infarction involving right coronary artery: Secondary | ICD-10-CM | POA: Diagnosis not present

## 2024-01-05 DIAGNOSIS — R079 Chest pain, unspecified: Secondary | ICD-10-CM | POA: Insufficient documentation

## 2024-01-05 DIAGNOSIS — I255 Ischemic cardiomyopathy: Secondary | ICD-10-CM | POA: Insufficient documentation

## 2024-01-05 NOTE — Progress Notes (Signed)
 During cardiac rehab session pt c/o chest pain 5/10 and SOB within 1 minute of beginning his walking exercise.  Pt stated this often happens when he walks at home.  Pain reduced to 2/10 by 1530 while resting, and by 1535 pt stated pain was 0/10.    122/74, 100% on RA, 65 HR.    APP reviewed and signed the EKG.  Per Josefa Beauvais, NP Pt may proceed with physical activity as long as he is chest pain-free and if he develops further chest discomfort during exercise he may need follow-up appointment for medication review and titration.  Educated pt that if he has any further chest pain while here to let us  know immediately and if he has any outside of cardiac rehab, to contact his cardiology office and if it it doesn't go away, to present immediately to the ED.

## 2024-01-05 NOTE — Progress Notes (Signed)
 Daily Session Note  Patient Details  Name: Richard Mclaughlin MRN: [id number] Date of Birth: 1940/12/23 Referring Provider:   Flowsheet Row INTENSIVE CARDIAC REHAB ORIENT from 12/30/2023 in [facility]  Referring Provider Ladona    Encounter Date: 01/05/2024  Check In:  Session Check In - 01/05/24 1447       Check-In   Supervising physician immediately available to respond to emergencies [facility] MD immediately available    Physician(s) Josefa Beauvais NP    Location [facility]    Staff Present Curtistine Gal, MS, ACSM-CEP, Exercise Physiologist;Jetta Vannie BS, ACSM-CEP, Exercise Physiologist;Maria Whitaker, RN, Mallory Parkins, MS, ACSM-CEP, CCRP, Exercise Physiologist;[place] Maiorino Lennon, RN, Marcine Pereyra, MS, Exercise Physiologist   JD Lennon RN BSN   Virtual Visit No    Medication changes reported     No    Fall or balance concerns reported    No    Tobacco Cessation No Change    Current number of cigarettes/nicotine per day     0    Warm-up and Cool-down Performed as group-led instruction   Cardiac Rehab Orientation   Resistance Training Performed Yes    VAD Patient? No    PAD/SET Patient? No      Pain Assessment   Currently in Pain? No/denies    Pain Score 0-No pain    Multiple Pain Sites No          Capillary Blood Glucose: No results found for this or any previous visit (from the past 24 hours).   Exercise Prescription Changes - 01/05/24 1638       Response to Exercise   Blood Pressure (Admit) 98/60    Blood Pressure (Exercise) 140/82    Blood Pressure (Exit) 142/80    Heart Rate (Admit) 69 bpm    Heart Rate (Exercise) 88 bpm    Heart Rate (Exit) 68 bpm    Rating of Perceived Exertion (Exercise) 13    Perceived Dyspnea (Exercise) 0    Symptoms Chest pain during walking. 4/10. Resolved with rest. On site provider and nurse aware. Seenote in epic    Comments Pt first day in the  [facility] program    Duration Progress to 30 minutes of  aerobic without signs/symptoms of physical distress    Intensity THRR unchanged      Progression   Progression Continue to progress workloads to maintain intensity without signs/symptoms of physical distress.    Average METs 2.4      Resistance Training   Training Prescription Yes    Weight 3 lbs    Reps 10-15    Time 10 Minutes      Track   Laps 11    Minutes 10   CP after 10 min of walking the track. See nurse note in EPIC   METs 2          Social History   Tobacco Use  Smoking Status Former   Current packs/day: 0.00   Types: Cigarettes   Quit date: 06/04/1983   Years since quitting: 40.6  Smokeless Tobacco Not on file    Goals Met:  Strength training completed today  Goals Unmet:  Chest Pain  Comments: Pt started cardiac rehab today.  Pt did not tolerate walking exercise, endorsed chest pain.  Exercise stopped, EKG obtained.  APP cleared pt to resume exercise.  See previous nursing note from today (01/05/2024) for further details.  VSS, telemetry-Sinus Rhythm when  at rest.  Medication list reconciled on orientation date. Pt denies barriers to medication compliance.  PSYCHOSOCIAL ASSESSMENT:  PHQ-0. Pt exhibits positive coping skills, hopeful outlook with supportive family. No psychosocial needs identified at this time, no psychosocial interventions necessary.    Pt enjoys Education officer, environmental.   Pt oriented to exercise equipment and routine.    Understanding verbalized.     Dr. Wilbert Bihari is Medical Director for Cardiac Rehab at [facility].

## 2024-01-07 ENCOUNTER — Encounter: Payer: Self-pay | Admitting: Internal Medicine

## 2024-01-07 ENCOUNTER — Encounter (HOSPITAL_COMMUNITY)
Admission: RE | Admit: 2024-01-07 | Discharge: 2024-01-07 | Disposition: A | Discharge status: Home / Self Care | Source: Ambulatory Visit | Attending: Cardiology | Admitting: Cardiology

## 2024-01-07 DIAGNOSIS — I2111 ST elevation (STEMI) myocardial infarction involving right coronary artery: Secondary | ICD-10-CM | POA: Diagnosis not present

## 2024-01-07 DIAGNOSIS — R079 Chest pain, unspecified: Secondary | ICD-10-CM | POA: Diagnosis not present

## 2024-01-07 DIAGNOSIS — I255 Ischemic cardiomyopathy: Secondary | ICD-10-CM | POA: Diagnosis not present

## 2024-01-07 NOTE — Progress Notes (Signed)
 Incomplete Session Note  Patient Details  Name: Richard Mclaughlin MRN: [id number] Date of Birth: 10-Oct-1940 Referring Provider:   Flowsheet Row INTENSIVE CARDIAC REHAB ORIENT from 12/30/2023 in [facility]  Referring Provider Zamari Vea did not complete his rehab session.  [name] reported having epigastric pain when walking on the walkig track after completing 6 laps. [name] said the discomfort is a 3/10 on the walking track. Patient placed on the zoll telemetry rhythm Sinus with t wave inversion. 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 Patient placed on O2 at 2l/min. [name] said the discomfort went away after about 5 minutes. Onsite provider Orren Fabry [facility] noted 12 lead ECG obtained. Tessa reviewed 12 lead no changes noted. Appointment obtained for Mr Ono to see Dr Ladona on 01/12/24. Patient and wife aware of appointment. Will hold exercise until cleared to return to exercise. [name] said that he has epigastric pain every time he walks with his wife outside. Patient instructed to hold off on walking long distances outside until he is seen in the office. Patient states understanding. ED precautions reviewed. Dale left cardiac rehab without further symptoms. Exit BP 144/90.Hadassah Elpidio Quan RN BSN

## 2024-01-07 NOTE — Telephone Encounter (Signed)
 error

## 2024-01-09 ENCOUNTER — Encounter (HOSPITAL_COMMUNITY): Admission: RE | Admit: 2024-01-09 | Source: Ambulatory Visit

## 2024-01-09 NOTE — Progress Notes (Signed)
 Cardiac Individual Treatment Plan  Patient Details  Name: Richard Mclaughlin MRN: [id number] Date of Birth: 1940/06/17 Referring Provider:   Flowsheet Row INTENSIVE CARDIAC REHAB ORIENT from 12/30/2023 in [facility] for Heart, Vascular, & Lung Health  Referring Provider Ganji    Initial Encounter Date:  Flowsheet Row INTENSIVE CARDIAC REHAB ORIENT from 12/30/2023 in [facility] for Heart, Vascular, & Lung Health  Date 12/30/23    Visit Diagnosis: Chest pain, unspecified type - Plan: EKG 12-Lead, EKG 12-Lead  Patient's Home Medications on Admission:  Current Outpatient Medications:    acetaminophen  (TYLENOL ) 500 MG tablet, Take 500-1,000 mg by mouth every 6 (six) hours as needed for moderate pain (pain score 4-6)., Disp: , Rfl:    aspirin  EC 81 MG tablet, Take 81 mg by mouth daily., Disp: , Rfl:    atorvastatin  (LIPITOR ) 80 MG tablet, Take 1 tablet (80 mg total) by mouth daily., Disp: 90 tablet, Rfl: 3   carvedilol  (COREG ) 3.125 MG tablet, Take 1 tablet (3.125 mg total) by mouth 2 (two) times daily with a meal., Disp: 180 tablet, Rfl: 3   cetirizine (ZYRTEC) 10 MG tablet, Take 10 mg by mouth daily., Disp: , Rfl:    clopidogrel  (PLAVIX ) 75 MG tablet, Take 1 tablet (75 mg total) by mouth daily., Disp: 90 tablet, Rfl: 3   cyanocobalamin (,VITAMIN B-12,) 1000 MCG/ML injection, Inject 1,000 mcg into the muscle every 30 (thirty) days. (Patient not taking: Reported on 12/01/2023), Disp: , Rfl:    Cyanocobalamin (B-12 PO), Take 1 tablet by mouth daily., Disp: , Rfl:    ezetimibe  (ZETIA ) 10 MG tablet, Take 1 tablet (10 mg total) by mouth daily., Disp: 90 tablet, Rfl: 3   Multiple Vitamin (MULTIVITAMIN WITH MINERALS) TABS tablet, Take 1 tablet by mouth daily., Disp: , Rfl:    Multiple Vitamins-Minerals (ZINC PO), Take 1 tablet by mouth daily as needed (when starting to feel sick.)., Disp: , Rfl:    nitroGLYCERIN  (NITROSTAT ) 0.4 MG SL tablet, Place 1  tablet (0.4 mg total) under the tongue every 5 (five) minutes x 3 doses as needed for chest pain., Disp: 10 tablet, Rfl: 12   sacubitril -valsartan  (ENTRESTO ) 24-26 MG, Take 1 tablet by mouth 2 (two) times daily., Disp: 60 tablet, Rfl: 11   VITAMIN D PO, Take 1 tablet by mouth daily., Disp: , Rfl:   Past Medical History: Past Medical History:  Diagnosis Date   CKD stage 3a, GFR 45-59 ml/min (HCC) 09/19/2023   High cholesterol    Hypertension 09/19/2023   Non-insulin  dependent type 2 diabetes mellitus (HCC) 09/19/2023   OSA (obstructive sleep apnea) 09/19/2023    Tobacco Use: Social History   Tobacco Use  Smoking Status Former   Current packs/day: 0.00   Types: Cigarettes   Quit date: 06/04/1983   Years since quitting: 40.6  Smokeless Tobacco Not on file    Labs: Review Flowsheet       Latest Ref Rng & Units 09/19/2023  Labs for ITP Cardiac and Pulmonary Rehab  Hemoglobin A1c 4.8 - 5.6 % 6.4     Capillary Blood Glucose: Lab Results  Component Value Date   GLUCAP 129 (H) 09/21/2023   GLUCAP 119 (H) 09/21/2023   GLUCAP 141 (H) 09/20/2023   GLUCAP 178 (H) 09/20/2023   GLUCAP 126 (H) 09/20/2023     Exercise Target Goals: Exercise Program Goal: Individual exercise prescription set using results from initial 6 min walk test and THRR while considering  patient's activity barriers and safety.   Exercise Prescription Goal: Initial exercise prescription builds to 30-45 minutes a day of aerobic activity, 2-3 days per week.  Home exercise guidelines will be given to patient during program as part of exercise prescription that the participant will acknowledge.  Activity Barriers & Risk Stratification:  Activity Barriers & Cardiac Risk Stratification - 12/30/23 1449       Activity Barriers & Cardiac Risk Stratification   Activity Barriers Balance Concerns;Joint Problems;Deconditioning;Arthritis;Decreased Ventricular Function    Cardiac Risk Stratification High          6  Minute Walk:  6 Minute Walk     Row Name 12/30/23 1430         6 Minute Walk   Phase Initial     Distance 1315 feet     Walk Time 6 minutes     # of Rest Breaks 0     MPH 2.5     METS 2.3     RPE 11     Perceived Dyspnea  0     VO2 Peak 7.8     Symptoms No     Resting HR 69 bpm     Resting BP 96/68     Resting Oxygen Saturation  96 %     Exercise Oxygen Saturation  during 6 min walk 96 %     Max Ex. HR 84 bpm     Max Ex. BP 108/66     2 Minute Post BP 100/60        Oxygen Initial Assessment:   Oxygen Re-Evaluation:   Oxygen Discharge (Final Oxygen Re-Evaluation):   Initial Exercise Prescription:  Initial Exercise Prescription - 12/30/23 1500       Date of Initial Exercise RX and Referring Provider   Date 12/30/23    Referring Provider Ganji    Expected Discharge Date 03/24/24      Bike   Level 1    Watts 50    Minutes 15    METs 2      Track   Minutes 15    METs 2      Prescription Details   Frequency (times per week) 3    Duration Progress to 30 minutes of continuous aerobic without signs/symptoms of physical distress      Intensity   THRR 40-80% of Max Heartrate 55-110    Ratings of Perceived Exertion 11-13    Perceived Dyspnea 0-4      Progression   Progression Continue to progress workloads to maintain intensity without signs/symptoms of physical distress.      Resistance Training   Training Prescription Yes    Weight 3 lbs    Reps 10-15          Perform Capillary Blood Glucose checks as needed.  Exercise Prescription Changes:   Exercise Prescription Changes     Row Name 01/05/24 1638             Response to Exercise   Blood Pressure (Admit) 98/60       Blood Pressure (Exercise) 140/82       Blood Pressure (Exit) 142/80       Heart Rate (Admit) 69 bpm       Heart Rate (Exercise) 88 bpm       Heart Rate (Exit) 68 bpm       Rating of Perceived Exertion (Exercise) 13       Perceived Dyspnea (Exercise) 0  Symptoms  Chest pain during walking. 4/10. Resolved with rest. On site provider and nurse aware. Seenote in epic       Comments Pt first day in the CRP2 Pritikin ICR program       Duration Progress to 30 minutes of  aerobic without signs/symptoms of physical distress       Intensity THRR unchanged         Progression   Progression Continue to progress workloads to maintain intensity without signs/symptoms of physical distress.       Average METs 2.4         Resistance Training   Training Prescription Yes       Weight 3 lbs       Reps 10-15       Time 10 Minutes         Track   Laps 11       Minutes 10  CP after 10 min of walking the track. See nurse note in EPIC       METs 2          Exercise Comments:   Exercise Comments     Row Name 01/05/24 1646           Exercise Comments Pt first day in the Pritikin ICR program. Pt tolerated exercise fair symptomatic with CP while walking the track, resolved with rest. See nurse note in EPIC. Cleared to return to exercise. Average MET's 2.4. Will continue to moniutor pt          Exercise Goals and Review:   Exercise Goals     Row Name 12/30/23 1449             Exercise Goals   Increase Physical Activity Yes       Intervention Provide advice, education, support and counseling about physical activity/exercise needs.;Develop an individualized exercise prescription for aerobic and resistive training based on initial evaluation findings, risk stratification, comorbidities and participant's personal goals.       Expected Outcomes Short Term: Attend rehab on a regular basis to increase amount of physical activity.;Long Term: Exercising regularly at least 3-5 days a week.;Long Term: Add in home exercise to make exercise part of routine and to increase amount of physical activity.       Increase Strength and Stamina Yes       Intervention Provide advice, education, support and counseling about physical activity/exercise needs.;Develop an  individualized exercise prescription for aerobic and resistive training based on initial evaluation findings, risk stratification, comorbidities and participant's personal goals.       Expected Outcomes Short Term: Increase workloads from initial exercise prescription for resistance, speed, and METs.;Short Term: Perform resistance training exercises routinely during rehab and add in resistance training at home;Long Term: Improve cardiorespiratory fitness, muscular endurance and strength as measured by increased METs and functional capacity ( )       Able to understand and use rate of perceived exertion (RPE) scale Yes       Intervention Provide education and explanation on how to use RPE scale       Expected Outcomes Short Term: Able to use RPE daily in rehab to express subjective intensity level;Long Term:  Able to use RPE to guide intensity level when exercising independently       Knowledge and understanding of Target Heart Rate Range (THRR) Yes       Intervention Provide education and explanation of THRR including how the numbers were predicted and where they are located  for reference       Expected Outcomes Long Term: Able to use THRR to govern intensity when exercising independently;Short Term: Able to state/look up THRR;Short Term: Able to use daily as guideline for intensity in rehab       Understanding of Exercise Prescription Yes       Intervention Provide education, explanation, and written materials on patient's individual exercise prescription       Expected Outcomes Short Term: Able to explain program exercise prescription;Long Term: Able to explain home exercise prescription to exercise independently          Exercise Goals Re-Evaluation :  Exercise Goals Re-Evaluation     Row Name 01/05/24 1642             Exercise Goal Re-Evaluation   Exercise Goals Review Increase Physical Activity;Increase Strength and Stamina;Able to understand and use rate of perceived exertion (RPE)  scale;Knowledge and understanding of Target Heart Rate Range (THRR);Understanding of Exercise Prescription       Comments Pt first day in the Pritikin ICR program. Pt tolerated exercise fair symptomatic with CP while walking the track, resolved with rest. See nurse note in EPIC. Cleared to return to exercise. Average MET's 2.4. Will continue to moniutor pt       Expected Outcomes CP symptoms will resolve and can continue to exercise without symptoms          Discharge Exercise Prescription (Final Exercise Prescription Changes):  Exercise Prescription Changes - 01/05/24 1638       Response to Exercise   Blood Pressure (Admit) 98/60    Blood Pressure (Exercise) 140/82    Blood Pressure (Exit) 142/80    Heart Rate (Admit) 69 bpm    Heart Rate (Exercise) 88 bpm    Heart Rate (Exit) 68 bpm    Rating of Perceived Exertion (Exercise) 13    Perceived Dyspnea (Exercise) 0    Symptoms Chest pain during walking. 4/10. Resolved with rest. On site provider and nurse aware. Seenote in epic    Comments Pt first day in the CRP2 Pritikin ICR program    Duration Progress to 30 minutes of  aerobic without signs/symptoms of physical distress    Intensity THRR unchanged      Progression   Progression Continue to progress workloads to maintain intensity without signs/symptoms of physical distress.    Average METs 2.4      Resistance Training   Training Prescription Yes    Weight 3 lbs    Reps 10-15    Time 10 Minutes      Track   Laps 11    Minutes 10   CP after 10 min of walking the track. See nurse note in EPIC   METs 2          Nutrition:  Target Goals: Understanding of nutrition guidelines, daily intake of sodium 1500mg , cholesterol 200mg , calories 30% from fat and 7% or less from saturated fats, daily to have 5 or more servings of fruits and vegetables.  Biometrics:  Pre Biometrics - 12/30/23 1345       Pre Biometrics   Waist Circumference 39.5 inches    Hip Circumference 42  inches    Waist to Hip Ratio 0.94 %    Triceps Skinfold 13 mm    % Body Fat 25.8 %    Grip Strength 38 kg    Flexibility 0 in   COULD NOT REACH   Single Leg Stand 7.18 seconds  Nutrition Therapy Plan and Nutrition Goals:  Nutrition Therapy & Goals - 01/06/24 1442       Nutrition Therapy   Diet Heart Healthy Diet      Personal Nutrition Goals   Nutrition Goal Patient to identify strategies for reducing cardiovascular risk by attending the Pritikin education and nutrition series weekly.    Personal Goal #2 Patient to improve diet quality by using the plate method as a guide for meal planning to include lean protein/plant protein, fruits, vegetables, whole grains, nonfat dairy as part of a well-balanced diet.    Comments [name] has medical history of STEMI, Cardiomyopathy, HTN, hyperlipidemia, CHF, OSA,DM2. LDL has improved but not yet at goal of <55; continues on zetia , lipitor . A1c is well controlled at goal. Patient will benefit from participation in intensive cardiac rehab for nutrition education, exercise, and lifestyle modification.      Intervention Plan   Intervention Prescribe, educate and counsel regarding individualized specific dietary modifications aiming towards targeted core components such as weight, hypertension, lipid management, diabetes, heart failure and other comorbidities.;Nutrition handout(s) given to patient.    Expected Outcomes Short Term Goal: Understand basic principles of dietary content, such as calories, fat, sodium, cholesterol and nutrients.;Long Term Goal: Adherence to prescribed nutrition plan.          Nutrition Assessments:  Nutrition Assessments - 01/09/24 1531       Rate Your Plate Scores   Pre Score 45         MEDIFICTS Score Key: >=70 Need to make dietary changes  40-70 Heart Healthy Diet <= 40 Therapeutic Level Cholesterol Diet   Flowsheet Row INTENSIVE CARDIAC REHAB from 01/07/2024 in [facility] for  Heart, Vascular, & Lung Health  Picture Your Plate Total Score on Admission 45   Picture Your Plate Scores: <59 Unhealthy dietary pattern with much room for improvement. 41-50 Dietary pattern unlikely to meet recommendations for good health and room for improvement. 51-60 More healthful dietary pattern, with some room for improvement.  >60 Healthy dietary pattern, although there may be some specific behaviors that could be improved.    Nutrition Goals Re-Evaluation:  Nutrition Goals Re-Evaluation     Row Name 01/06/24 1442             Goals   Current Weight 191 lb 2.2 oz (86.7 kg)       Comment LDL 85. Cr 1.27, GFR 56, Lpa 143, A1c 6.4       Expected Outcome [name] has medical history of STEMI, Cardiomyopathy, HTN, hyperlipidemia, CHF, OSA,DM2. LDL has improved but not yet at goal of <55; continues on zetia , lipitor . A1c is well controlled at goal. Patient will benefit from participation in intensive cardiac rehab for nutrition education, exercise, and lifestyle modification.          Nutrition Goals Re-Evaluation:  Nutrition Goals Re-Evaluation     Row Name 01/06/24 1442             Goals   Current Weight 191 lb 2.2 oz (86.7 kg)       Comment LDL 85. Cr 1.27, GFR 56, Lpa 143, A1c 6.4       Expected Outcome [name] has medical history of STEMI, Cardiomyopathy, HTN, hyperlipidemia, CHF, OSA,DM2. LDL has improved but not yet at goal of <55; continues on zetia , lipitor . A1c is well controlled at goal. Patient will benefit from participation in intensive cardiac rehab for nutrition education, exercise, and lifestyle modification.  Nutrition Goals Discharge (Final Nutrition Goals Re-Evaluation):  Nutrition Goals Re-Evaluation - 01/06/24 1442       Goals   Current Weight 191 lb 2.2 oz (86.7 kg)    Comment LDL 85. Cr 1.27, GFR 56, Lpa 143, A1c 6.4    Expected Outcome [name] has medical history of STEMI, Cardiomyopathy, HTN, hyperlipidemia, CHF, OSA,DM2. LDL has improved but  not yet at goal of <55; continues on zetia , lipitor . A1c is well controlled at goal. Patient will benefit from participation in intensive cardiac rehab for nutrition education, exercise, and lifestyle modification.          Psychosocial: Target Goals: Acknowledge presence or absence of significant depression and/or stress, maximize coping skills, provide positive support system. Participant is able to verbalize types and ability to use techniques and skills needed for reducing stress and depression.  Initial Review & Psychosocial Screening:  Initial Psych Review & Screening - 12/30/23 1533       Initial Review   Current issues with None Identified      Family Dynamics   Good Support System? Yes   Pt has good support system with his wife     Barriers   Psychosocial barriers to participate in program There are no identifiable barriers or psychosocial needs.      Screening Interventions   Interventions Encouraged to exercise    Expected Outcomes Long Term goal: The participant improves quality of Life and PHQ9 Scores as seen by post scores and/or verbalization of changes          Quality of Life Scores:  Quality of Life - 12/30/23 1455       Quality of Life   Select Quality of Life      Quality of Life Scores   Health/Function Pre 28 %    Socioeconomic Pre 30 %    Psych/Spiritual Pre 30 %    Family Post 28.8 %    GLOBAL Pre 29.31 %         Scores of 19 and below usually indicate a poorer quality of life in these areas.  A difference of  2-3 points is a clinically meaningful difference.  A difference of 2-3 points in the total score of the Quality of Life Index has been associated with significant improvement in overall quality of life, self-image, physical symptoms, and general health in studies assessing change in quality of life.  PHQ-9: Review Flowsheet       12/30/2023  Depression screen PHQ 2/9  Decreased Interest 0  Down, Depressed, Hopeless 0  PHQ - 2 Score  0  Altered sleeping 0  Tired, decreased energy 0  Change in appetite 0  Feeling bad or failure about yourself  0  Trouble concentrating 0  Moving slowly or fidgety/restless 0  Suicidal thoughts 0  PHQ-9 Score 0   Interpretation of Total Score  Total Score Depression Severity:  1-4 = Minimal depression, 5-9 = Mild depression, 10-14 = Moderate depression, 15-19 = Moderately severe depression, 20-27 = Severe depression   Psychosocial Evaluation and Intervention:   Psychosocial Re-Evaluation:  Psychosocial Re-Evaluation     Row Name 01/08/24 [phone]             Psychosocial Re-Evaluation   Current issues with Current Stress Concerns       Comments Exercise is currently on hold       Continue Psychosocial Services  Follow up required by staff         Initial Review  Source of Stress Concerns Chronic Illness       Comments Will reevaluate when patient is cleared to return to group exercise at cardiac rehab.          Psychosocial Discharge (Final Psychosocial Re-Evaluation):  Psychosocial Re-Evaluation - 01/08/24 0841       Psychosocial Re-Evaluation   Current issues with Current Stress Concerns    Comments Exercise is currently on hold    Continue Psychosocial Services  Follow up required by staff      Initial Review   Source of Stress Concerns Chronic Illness    Comments Will reevaluate when patient is cleared to return to group exercise at cardiac rehab.          Vocational Rehabilitation: Provide vocational rehab assistance to qualifying candidates.   Vocational Rehab Evaluation & Intervention:  Vocational Rehab - 12/30/23 1455       Initial Vocational Rehab Evaluation & Intervention   Assessment shows need for Vocational Rehabilitation No   Pt is retired         Education: Education Goals: Education classes will be provided on a weekly basis, covering required topics. Participant will state understanding/return demonstration of topics presented.     Education     Row Name 01/05/24 1400     Education   Cardiac Education Topics Pritikin   Glass blower/designer Nutrition   Nutrition Workshop Label Reading   Instruction Review Code 1- Teaching laboratory technician Start Time 1400   Class Stop Time 1450   Class Time Calculation (min) 50 min    Row Name 01/07/24 1500     Education   Cardiac Education Topics Pritikin   Customer service manager   Weekly Topic Fast and Healthy Breakfasts   Instruction Review Code 1- Verbalizes Understanding   Class Start Time 1400   Class Stop Time 1440   Class Time Calculation (min) 40 min      Core Videos: Exercise    Move It!  Clinical staff conducted group or individual video education with verbal and written material and guidebook.  Patient learns the recommended Pritikin exercise program. Exercise with the goal of living a long, healthy life. Some of the health benefits of exercise include controlled diabetes, healthier blood pressure levels, improved cholesterol levels, improved heart and lung capacity, improved sleep, and better body composition. Everyone should speak with their doctor before starting or changing an exercise routine.  Biomechanical Limitations Clinical staff conducted group or individual video education with verbal and written material and guidebook.  Patient learns how biomechanical limitations can impact exercise and how we can mitigate and possibly overcome limitations to have an impactful and balanced exercise routine.  Body Composition Clinical staff conducted group or individual video education with verbal and written material and guidebook.  Patient learns that body composition (ratio of muscle mass to fat mass) is a key component to assessing overall fitness, rather than body weight alone. Increased fat mass, especially visceral belly fat, can put us  at increased risk for metabolic  syndrome, type 2 diabetes, heart disease, and even death. It is recommended to combine diet and exercise (cardiovascular and resistance training) to improve your body composition. Seek guidance from your physician and exercise physiologist before implementing an exercise routine.  Exercise Action Plan Clinical staff conducted group or individual video education with verbal and written material and guidebook.  Patient learns the recommended strategies to achieve and enjoy long-term exercise adherence, including variety, self-motivation, self-efficacy, and positive decision making. Benefits of exercise include fitness, good health, weight management, more energy, better sleep, less stress, and overall well-being.  Medical   Heart Disease Risk Reduction Clinical staff conducted group or individual video education with verbal and written material and guidebook.  Patient learns our heart is our most vital organ as it circulates oxygen, nutrients, white blood cells, and hormones throughout the entire body, and carries waste away. Data supports a plant-based eating plan like the Pritikin Program for its effectiveness in slowing progression of and reversing heart disease. The video provides a number of recommendations to address heart disease.   Metabolic Syndrome and Belly Fat  Clinical staff conducted group or individual video education with verbal and written material and guidebook.  Patient learns what metabolic syndrome is, how it leads to heart disease, and how one can reverse it and keep it from coming back. You have metabolic syndrome if you have 3 of the following 5 criteria: abdominal obesity, high blood pressure, high triglycerides, low HDL cholesterol, and high blood sugar.  Hypertension and Heart Disease Clinical staff conducted group or individual video education with verbal and written material and guidebook.  Patient learns that high blood pressure, or hypertension, is very common in the  [place] . Hypertension is largely due to excessive salt intake, but other important risk factors include being overweight, physical inactivity, drinking too much alcohol, smoking, and not eating enough potassium from fruits and vegetables. High blood pressure is a leading risk factor for heart attack, stroke, congestive heart failure, dementia, kidney failure, and premature death. Long-term effects of excessive salt intake include stiffening of the arteries and thickening of heart muscle and organ damage. Recommendations include ways to reduce hypertension and the risk of heart disease.  Diseases of Our Time - Focusing on Diabetes Clinical staff conducted group or individual video education with verbal and written material and guidebook.  Patient learns why the best way to stop diseases of our time is prevention, through food and other lifestyle changes. Medicine (such as prescription pills and surgeries) is often only a Band-Aid on the problem, not a long-term solution. Most common diseases of our time include obesity, type 2 diabetes, hypertension, heart disease, and cancer. The Pritikin Program is recommended and has been proven to help reduce, reverse, and/or prevent the damaging effects of metabolic syndrome.  Nutrition   Overview of the Pritikin Eating Plan  Clinical staff conducted group or individual video education with verbal and written material and guidebook.  Patient learns about the Pritikin Eating Plan for disease risk reduction. The Pritikin Eating Plan emphasizes a wide variety of unrefined, minimally-processed carbohydrates, like fruits, vegetables, whole grains, and legumes. Go, Caution, and Stop food choices are explained. Plant-based and lean animal proteins are emphasized. Rationale provided for low sodium intake for blood pressure control, low added sugars for blood sugar stabilization, and low added fats and oils for coronary artery disease risk reduction and weight  management.  Calorie Density  Clinical staff conducted group or individual video education with verbal and written material and guidebook.  Patient learns about calorie density and how it impacts the Pritikin Eating Plan. Knowing the characteristics of the food you choose will help you decide whether those foods will lead to weight gain or weight loss, and whether you want to consume more or less of them. Weight loss is usually a side effect of the Pritikin  Eating Plan because of its focus on low calorie-dense foods.  Label Reading  Clinical staff conducted group or individual video education with verbal and written material and guidebook.  Patient learns about the Pritikin recommended label reading guidelines and corresponding recommendations regarding calorie density, added sugars, sodium content, and whole grains.  Dining Out - Part 1  Clinical staff conducted group or individual video education with verbal and written material and guidebook.  Patient learns that restaurant meals can be sabotaging because they can be so high in calories, fat, sodium, and/or sugar. Patient learns recommended strategies on how to positively address this and avoid unhealthy pitfalls.  Facts on Fats  Clinical staff conducted group or individual video education with verbal and written material and guidebook.  Patient learns that lifestyle modifications can be just as effective, if not more so, as many medications for lowering your risk of heart disease. A Pritikin lifestyle can help to reduce your risk of inflammation and atherosclerosis (cholesterol build-up, or plaque, in the artery walls). Lifestyle interventions such as dietary choices and physical activity address the cause of atherosclerosis. A review of the types of fats and their impact on blood cholesterol levels, along with dietary recommendations to reduce fat intake is also included.  Nutrition Action Plan  Clinical staff conducted group or individual  video education with verbal and written material and guidebook.  Patient learns how to incorporate Pritikin recommendations into their lifestyle. Recommendations include planning and keeping personal health goals in mind as an important part of their success.  Healthy Mind-Set    Healthy Minds, Bodies, Hearts  Clinical staff conducted group or individual video education with verbal and written material and guidebook.  Patient learns how to identify when they are stressed. Video will discuss the impact of that stress, as well as the many benefits of stress management. Patient will also be introduced to stress management techniques. The way we think, act, and feel has an impact on our hearts.  How Our Thoughts Can Heal Our Hearts  Clinical staff conducted group or individual video education with verbal and written material and guidebook.  Patient learns that negative thoughts can cause depression and anxiety. This can result in negative lifestyle behavior and serious health problems. Cognitive behavioral therapy is an effective method to help control our thoughts in order to change and improve our emotional outlook.  Additional Videos:  Exercise    Improving Performance  Clinical staff conducted group or individual video education with verbal and written material and guidebook.  Patient learns to use a non-linear approach by alternating intensity levels and lengths of time spent exercising to help burn more calories and lose more body fat. Cardiovascular exercise helps improve heart health, metabolism, hormonal balance, blood sugar control, and recovery from fatigue. Resistance training improves strength, endurance, balance, coordination, reaction time, metabolism, and muscle mass. Flexibility exercise improves circulation, posture, and balance. Seek guidance from your physician and exercise physiologist before implementing an exercise routine and learn your capabilities and proper form for all  exercise.  Introduction to Yoga  Clinical staff conducted group or individual video education with verbal and written material and guidebook.  Patient learns about yoga, a discipline of the coming together of mind, breath, and body. The benefits of yoga include improved flexibility, improved range of motion, better posture and core strength, increased lung function, weight loss, and positive self-image. Yoga's heart health benefits include lowered blood pressure, healthier heart rate, decreased cholesterol and triglyceride levels, improved immune function, and reduced  stress. Seek guidance from your physician and exercise physiologist before implementing an exercise routine and learn your capabilities and proper form for all exercise.  Medical   Aging: Enhancing Your Quality of Life  Clinical staff conducted group or individual video education with verbal and written material and guidebook.  Patient learns key strategies and recommendations to stay in good physical health and enhance quality of life, such as prevention strategies, having an advocate, securing a Health Care Proxy and Power of Attorney, and keeping a list of medications and system for tracking them. It also discusses how to avoid risk for bone loss.  Biology of Weight Control  Clinical staff conducted group or individual video education with verbal and written material and guidebook.  Patient learns that weight gain occurs because we consume more calories than we burn (eating more, moving less). Even if your body weight is normal, you may have higher ratios of fat compared to muscle mass. Too much body fat puts you at increased risk for cardiovascular disease, heart attack, stroke, type 2 diabetes, and obesity-related cancers. In addition to exercise, following the Pritikin Eating Plan can help reduce your risk.  Decoding Lab Results  Clinical staff conducted group or individual video education with verbal and written material and  guidebook.  Patient learns that lab test reflects one measurement whose values change over time and are influenced by many factors, including medication, stress, sleep, exercise, food, hydration, pre-existing medical conditions, and more. It is recommended to use the knowledge from this video to become more involved with your lab results and evaluate your numbers to speak with your doctor.   Diseases of Our Time - Overview  Clinical staff conducted group or individual video education with verbal and written material and guidebook.  Patient learns that according to the CDC, 50% to 70% of chronic diseases (such as obesity, type 2 diabetes, elevated lipids, hypertension, and heart disease) are avoidable through lifestyle improvements including healthier food choices, listening to satiety cues, and increased physical activity.  Sleep Disorders Clinical staff conducted group or individual video education with verbal and written material and guidebook.  Patient learns how good quality and duration of sleep are important to overall health and well-being. Patient also learns about sleep disorders and how they impact health along with recommendations to address them, including discussing with a physician.  Nutrition  Dining Out - Part 2 Clinical staff conducted group or individual video education with verbal and written material and guidebook.  Patient learns how to plan ahead and communicate in order to maximize their dining experience in a healthy and nutritious manner. Included are recommended food choices based on the type of restaurant the patient is visiting.   Fueling a Banker conducted group or individual video education with verbal and written material and guidebook.  There is a strong connection between our food choices and our health. Diseases like obesity and type 2 diabetes are very prevalent and are in large-part due to lifestyle choices. The Pritikin Eating Plan  provides plenty of food and hunger-curbing satisfaction. It is easy to follow, affordable, and helps reduce health risks.  Menu Workshop  Clinical staff conducted group or individual video education with verbal and written material and guidebook.  Patient learns that restaurant meals can sabotage health goals because they are often packed with calories, fat, sodium, and sugar. Recommendations include strategies to plan ahead and to communicate with the manager, chef, or server to help order a healthier meal.  Planning Your Eating Strategy  Clinical staff conducted group or individual video education with verbal and written material and guidebook.  Patient learns about the Pritikin Eating Plan and its benefit of reducing the risk of disease. The Pritikin Eating Plan does not focus on calories. Instead, it emphasizes high-quality, nutrient-rich foods. By knowing the characteristics of the foods, we choose, we can determine their calorie density and make informed decisions.  Targeting Your Nutrition Priorities  Clinical staff conducted group or individual video education with verbal and written material and guidebook.  Patient learns that lifestyle habits have a tremendous impact on disease risk and progression. This video provides eating and physical activity recommendations based on your personal health goals, such as reducing LDL cholesterol, losing weight, preventing or controlling type 2 diabetes, and reducing high blood pressure.  Vitamins and Minerals  Clinical staff conducted group or individual video education with verbal and written material and guidebook.  Patient learns different ways to obtain key vitamins and minerals, including through a recommended healthy diet. It is important to discuss all supplements you take with your doctor.   Healthy Mind-Set    Smoking Cessation  Clinical staff conducted group or individual video education with verbal and written material and guidebook.   Patient learns that cigarette smoking and tobacco addiction pose a serious health risk which affects millions of people. Stopping smoking will significantly reduce the risk of heart disease, lung disease, and many forms of cancer. Recommended strategies for quitting are covered, including working with your doctor to develop a successful plan.  Culinary   Becoming a Set designer conducted group or individual video education with verbal and written material and guidebook.  Patient learns that cooking at home can be healthy, cost-effective, quick, and puts them in control. Keys to cooking healthy recipes will include looking at your recipe, assessing your equipment needs, planning ahead, making it simple, choosing cost-effective seasonal ingredients, and limiting the use of added fats, salts, and sugars.  Cooking - Breakfast and Snacks  Clinical staff conducted group or individual video education with verbal and written material and guidebook.  Patient learns how important breakfast is to satiety and nutrition through the entire day. Recommendations include key foods to eat during breakfast to help stabilize blood sugar levels and to prevent overeating at meals later in the day. Planning ahead is also a key component.  Cooking - Educational psychologist conducted group or individual video education with verbal and written material and guidebook.  Patient learns eating strategies to improve overall health, including an approach to cook more at home. Recommendations include thinking of animal protein as a side on your plate rather than center stage and focusing instead on lower calorie dense options like vegetables, fruits, whole grains, and plant-based proteins, such as beans. Making sauces in large quantities to freeze for later and leaving the skin on your vegetables are also recommended to maximize your experience.  Cooking - Healthy Salads and Dressing Clinical staff  conducted group or individual video education with verbal and written material and guidebook.  Patient learns that vegetables, fruits, whole grains, and legumes are the foundations of the Pritikin Eating Plan. Recommendations include how to incorporate each of these in flavorful and healthy salads, and how to create homemade salad dressings. Proper handling of ingredients is also covered. Cooking - Soups and [employer] - Soups and Desserts Clinical staff conducted group or individual video education with verbal and written material and guidebook.  Patient learns that Pritikin soups and desserts make for easy, nutritious, and delicious snacks and meal components that are low in sodium, fat, sugar, and calorie density, while high in vitamins, minerals, and filling fiber. Recommendations include simple and healthy ideas for soups and desserts.   Overview     The Pritikin Solution Program Overview Clinical staff conducted group or individual video education with verbal and written material and guidebook.  Patient learns that the results of the Pritikin Program have been documented in more than 100 articles published in peer-reviewed journals, and the benefits include reducing risk factors for (and, in some cases, even reversing) high cholesterol, high blood pressure, type 2 diabetes, obesity, and more! An overview of the three key pillars of the Pritikin Program will be covered: eating well, doing regular exercise, and having a healthy mind-set.  WORKSHOPS  Exercise: Exercise Basics: Building Your Action Plan Clinical staff led group instruction and group discussion with PowerPoint presentation and patient guidebook. To enhance the learning environment the use of posters, models and videos may be added. At the conclusion of this workshop, patients will comprehend the difference between physical activity and exercise, as well as the benefits of incorporating both, into their routine. Patients will  understand the FITT (Frequency, Intensity, Time, and Type) principle and how to use it to build an exercise action plan. In addition, safety concerns and other considerations for exercise and cardiac rehab will be addressed by the presenter. The purpose of this lesson is to promote a comprehensive and effective weekly exercise routine in order to improve patients' overall level of fitness.   Managing Heart Disease: Your Path to a Healthier Heart Clinical staff led group instruction and group discussion with PowerPoint presentation and patient guidebook. To enhance the learning environment the use of posters, models and videos may be added.At the conclusion of this workshop, patients will understand the anatomy and physiology of the heart. Additionally, they will understand how Pritikin's three pillars impact the risk factors, the progression, and the management of heart disease.  The purpose of this lesson is to provide a high-level overview of the heart, heart disease, and how the Pritikin lifestyle positively impacts risk factors.  Exercise Biomechanics Clinical staff led group instruction and group discussion with PowerPoint presentation and patient guidebook. To enhance the learning environment the use of posters, models and videos may be added. Patients will learn how the structural parts of their bodies function and how these functions impact their daily activities, movement, and exercise. Patients will learn how to promote a neutral spine, learn how to manage pain, and identify ways to improve their physical movement in order to promote healthy living. The purpose of this lesson is to expose patients to common physical limitations that impact physical activity. Participants will learn practical ways to adapt and manage aches and pains, and to minimize their effect on regular exercise. Patients will learn how to maintain good posture while sitting, walking, and lifting.  Balance Training  and Fall Prevention  Clinical staff led group instruction and group discussion with PowerPoint presentation and patient guidebook. To enhance the learning environment the use of posters, models and videos may be added. At the conclusion of this workshop, patients will understand the importance of their sensorimotor skills (vision, proprioception, and the vestibular system) in maintaining their ability to balance as they age. Patients will apply a variety of balancing exercises that are appropriate for their current level of function. Patients will understand the common causes for poor balance,  possible solutions to these problems, and ways to modify their physical environment in order to minimize their fall risk. The purpose of this lesson is to teach patients about the importance of maintaining balance as they age and ways to minimize their risk of falling.  WORKSHOPS   Nutrition:  Fueling a Ship broker led group instruction and group discussion with PowerPoint presentation and patient guidebook. To enhance the learning environment the use of posters, models and videos may be added. Patients will review the foundational principles of the Pritikin Eating Plan and understand what constitutes a serving size in each of the food groups. Patients will also learn Pritikin-friendly foods that are better choices when away from home and review make-ahead meal and snack options. Calorie density will be reviewed and applied to three nutrition priorities: weight maintenance, weight loss, and weight gain. The purpose of this lesson is to reinforce (in a group setting) the key concepts around what patients are recommended to eat and how to apply these guidelines when away from home by planning and selecting Pritikin-friendly options. Patients will understand how calorie density may be adjusted for different weight management goals.  Mindful Eating  Clinical staff led group instruction and group  discussion with PowerPoint presentation and patient guidebook. To enhance the learning environment the use of posters, models and videos may be added. Patients will briefly review the concepts of the Pritikin Eating Plan and the importance of low-calorie dense foods. The concept of mindful eating will be introduced as well as the importance of paying attention to internal hunger signals. Triggers for non-hunger eating and techniques for dealing with triggers will be explored. The purpose of this lesson is to provide patients with the opportunity to review the basic principles of the Pritikin Eating Plan, discuss the value of eating mindfully and how to measure internal cues of hunger and fullness using the Hunger Scale. Patients will also discuss reasons for non-hunger eating and learn strategies to use for controlling emotional eating.  Targeting Your Nutrition Priorities Clinical staff led group instruction and group discussion with PowerPoint presentation and patient guidebook. To enhance the learning environment the use of posters, models and videos may be added. Patients will learn how to determine their genetic susceptibility to disease by reviewing their family history. Patients will gain insight into the importance of diet as part of an overall healthy lifestyle in mitigating the impact of genetics and other environmental insults. The purpose of this lesson is to provide patients with the opportunity to assess their personal nutrition priorities by looking at their family history, their own health history and current risk factors. Patients will also be able to discuss ways of prioritizing and modifying the Pritikin Eating Plan for their highest risk areas  Menu  Clinical staff led group instruction and group discussion with PowerPoint presentation and patient guidebook. To enhance the learning environment the use of posters, models and videos may be added. Using menus brought in from [employer],  or printed from [employer], patients will apply the Pritikin dining out guidelines that were presented in the [employer] video. Patients will also be able to practice these guidelines in a variety of provided scenarios. The purpose of this lesson is to provide patients with the opportunity to practice hands-on learning of the Pritikin Dining Out guidelines with actual menus and practice scenarios.  Label Reading Clinical staff led group instruction and group discussion with PowerPoint presentation and patient guidebook. To enhance the learning environment  the use of posters, models and videos may be added. Patients will review and discuss the Pritikin label reading guidelines presented in Pritikin's Label Reading Educational series video. Using fool labels brought in from local grocery stores and markets, patients will apply the label reading guidelines and determine if the packaged food meet the Pritikin guidelines. The purpose of this lesson is to provide patients with the opportunity to review, discuss, and practice hands-on learning of the Pritikin Label Reading guidelines with actual packaged food labels. Cooking School  Pritikin's [employer] are designed to teach patients ways to prepare quick, simple, and affordable recipes at home. The importance of nutrition's role in chronic disease risk reduction is reflected in its emphasis in the overall Pritikin program. By learning how to prepare essential core Pritikin Eating Plan recipes, patients will increase control over what they eat; be able to customize the flavor of foods without the use of added salt, sugar, or fat; and improve the quality of the food they consume. By learning a set of core recipes which are easily assembled, quickly prepared, and affordable, patients are more likely to prepare more healthy foods at home. These workshops focus on convenient breakfasts, simple entres, side dishes, and desserts which  can be prepared with minimal effort and are consistent with nutrition recommendations for cardiovascular risk reduction. Cooking [employer] are taught by a Armed forces logistics/support/administrative officer (RD) who has been trained by the [employer]. The chef or RD has a clear understanding of the importance of minimizing - if not completely eliminating - added fat, sugar, and sodium in recipes. Throughout the series of Cooking School Workshop sessions, patients will learn about healthy ingredients and efficient methods of cooking to build confidence in their capability to prepare    Cooking School weekly topics:  Adding Flavor- Sodium-Free  Fast and Healthy Breakfasts  Powerhouse Plant-Based Proteins  Satisfying Salads and Dressings  Simple Sides and Sauces  International Cuisine-Spotlight on the [employer] Zones  Delicious Desserts  Savory Soups  [employer] - Meals in a Astronomer Appetizers and Snacks  Comforting Weekend Breakfasts  One-Pot Wonders   Fast Evening Meals  Landscape architect Your Pritikin Plate  WORKSHOPS   Healthy Mindset (Psychosocial):  Focused Goals, Sustainable Changes Clinical staff led group instruction and group discussion with PowerPoint presentation and patient guidebook. To enhance the learning environment the use of posters, models and videos may be added. Patients will be able to apply effective goal setting strategies to establish at least one personal goal, and then take consistent, meaningful action toward that goal. They will learn to identify common barriers to achieving personal goals and develop strategies to overcome them. Patients will also gain an understanding of how our mind-set can impact our ability to achieve goals and the importance of cultivating a positive and growth-oriented mind-set. The purpose of this lesson is to provide patients with a deeper understanding of how to set and achieve personal goals, as well as the tools  and strategies needed to overcome common obstacles which may arise along the way.  From Head to Heart: The Power of a Healthy Outlook  Clinical staff led group instruction and group discussion with PowerPoint presentation and patient guidebook. To enhance the learning environment the use of posters, models and videos may be added. Patients will be able to recognize and describe the impact of emotions and mood on physical health. They will discover the importance of self-care and explore self-care practices  which may work for them. Patients will also learn how to utilize the 4 C's to cultivate a healthier outlook and better manage stress and challenges. The purpose of this lesson is to demonstrate to patients how a healthy outlook is an essential part of maintaining good health, especially as they continue their cardiac rehab journey.  Healthy Sleep for a Healthy Heart Clinical staff led group instruction and group discussion with PowerPoint presentation and patient guidebook. To enhance the learning environment the use of posters, models and videos may be added. At the conclusion of this workshop, patients will be able to demonstrate knowledge of the importance of sleep to overall health, well-being, and quality of life. They will understand the symptoms of, and treatments for, common sleep disorders. Patients will also be able to identify daytime and nighttime behaviors which impact sleep, and they will be able to apply these tools to help manage sleep-related challenges. The purpose of this lesson is to provide patients with a general overview of sleep and outline the importance of quality sleep. Patients will learn about a few of the most common sleep disorders. Patients will also be introduced to the concept of "sleep hygiene," and discover ways to self-manage certain sleeping problems through simple daily behavior changes. Finally, the workshop will motivate patients by clarifying the links between  quality sleep and their goals of heart-healthy living.   Recognizing and Reducing Stress Clinical staff led group instruction and group discussion with PowerPoint presentation and patient guidebook. To enhance the learning environment the use of posters, models and videos may be added. At the conclusion of this workshop, patients will be able to understand the types of stress reactions, differentiate between acute and chronic stress, and recognize the impact that chronic stress has on their health. They will also be able to apply different coping mechanisms, such as reframing negative self-talk. Patients will have the opportunity to practice a variety of stress management techniques, such as deep abdominal breathing, progressive muscle relaxation, and/or guided imagery.  The purpose of this lesson is to educate patients on the role of stress in their lives and to provide healthy techniques for coping with it.  Learning Barriers/Preferences:  Learning Barriers/Preferences - 12/30/23 1543       Learning Barriers/Preferences   Learning Barriers Sight   wears glasses   Learning Preferences Skilled Demonstration;Video;Pictoral;Individual Instruction;Group Instruction;Computer/Internet          Education Topics:  Knowledge Questionnaire Score:  Knowledge Questionnaire Score - 12/30/23 1543       Knowledge Questionnaire Score   Pre Score 22/24          Core Components/Risk Factors/Patient Goals at Admission:  Personal Goals and Risk Factors at Admission - 12/30/23 1452       Core Components/Risk Factors/Patient Goals on Admission    Weight Management Weight Maintenance    Heart Failure Yes    Intervention Provide a combined exercise and nutrition program that is supplemented with education, support and counseling about heart failure. Directed toward relieving symptoms such as shortness of breath, decreased exercise tolerance, and extremity edema.    Expected Outcomes Improve functional  capacity of life;Short term: Attendance in program 2-3 days a week with increased exercise capacity. Reported lower sodium intake. Reported increased fruit and vegetable intake. Reports medication compliance.;Short term: Daily weights obtained and reported for increase. Utilizing diuretic protocols set by physician.;Long term: Adoption of self-care skills and reduction of barriers for early signs and symptoms recognition and intervention leading to self-care maintenance.  Hypertension Yes    Intervention Provide education on lifestyle modifcations including regular physical activity/exercise, weight management, moderate sodium restriction and increased consumption of fresh fruit, vegetables, and low fat dairy, alcohol moderation, and smoking cessation.;Monitor prescription use compliance.    Expected Outcomes Short Term: Continued assessment and intervention until BP is < 140/56mm HG in hypertensive participants. < 130/61mm HG in hypertensive participants with diabetes, heart failure or chronic kidney disease.;Long Term: Maintenance of blood pressure at goal levels.    Lipids Yes    Intervention Provide education and support for participant on nutrition & aerobic/resistive exercise along with prescribed medications to achieve LDL 70mg , HDL >40mg .    Expected Outcomes Short Term: Participant states understanding of desired cholesterol values and is compliant with medications prescribed. Participant is following exercise prescription and nutrition guidelines.;Long Term: Cholesterol controlled with medications as prescribed, with individualized exercise RX and with personalized nutrition plan. Value goals: LDL < 70mg , HDL > 40 mg.          Core Components/Risk Factors/Patient Goals Review:   Goals and Risk Factor Review     Row Name 01/08/24 [phone]             Core Components/Risk Factors/Patient Goals Review   Personal Goals Review Weight Management/Obesity;Heart Failure;Lipids;Hypertension        Review [name] started exercise at cardiac rehab on 01/05/24. Exercise is ccurrently on hold due to exertional angina has office follow up on 01/12/24 with Dr Ladona.       Expected Outcomes [name] will resume cardiac rehab once cleared to return to exercise          Core Components/Risk Factors/Patient Goals at Discharge (Final Review):   Goals and Risk Factor Review - 01/08/24 0842       Core Components/Risk Factors/Patient Goals Review   Personal Goals Review Weight Management/Obesity;Heart Failure;Lipids;Hypertension    Review [name] started exercise at cardiac rehab on 01/05/24. Exercise is ccurrently on hold due to exertional angina has office follow up on 01/12/24 with Dr Ladona.    Expected Outcomes [name] will resume cardiac rehab once cleared to return to exercise          ITP Comments:  ITP Comments     Row Name 12/30/23 1415 01/08/24 0840         ITP Comments Wilbert Bihari, MD: Medical Director. Introduction to the [facility]/Intensive Cardiac Rehab. Initial orientation packet reviewed with the patient. 30 Day ITP Review. Exercise is currently on hold due to exertional angina         Comments: See ITP comments.Hadassah Elpidio Quan RN BSN

## 2024-01-12 ENCOUNTER — Other Ambulatory Visit: Payer: Self-pay | Admitting: *Deleted

## 2024-01-12 ENCOUNTER — Encounter (HOSPITAL_COMMUNITY)

## 2024-01-12 ENCOUNTER — Encounter: Payer: Self-pay | Admitting: Cardiology

## 2024-01-12 ENCOUNTER — Ambulatory Visit: Attending: Cardiology | Admitting: Cardiology

## 2024-01-12 VITALS — BP 124/66 | HR 70 | Resp 16 | Ht 73.0 in | Wt 191.0 lb

## 2024-01-12 DIAGNOSIS — I25118 Atherosclerotic heart disease of native coronary artery with other forms of angina pectoris: Secondary | ICD-10-CM

## 2024-01-12 DIAGNOSIS — G4733 Obstructive sleep apnea (adult) (pediatric): Secondary | ICD-10-CM

## 2024-01-12 DIAGNOSIS — E782 Mixed hyperlipidemia: Secondary | ICD-10-CM

## 2024-01-12 DIAGNOSIS — I255 Ischemic cardiomyopathy: Secondary | ICD-10-CM

## 2024-01-12 MED ORDER — CARVEDILOL 6.25 MG PO TABS: 6.2500 mg | ORAL_TABLET | Freq: Two times a day (BID) | ORAL | 3 refills | Status: DC

## 2024-01-12 MED ORDER — ISOSORBIDE MONONITRATE ER 30 MG PO TB24: 30.0000 mg | ORAL_TABLET | Freq: Every day | ORAL | 3 refills | Status: DC

## 2024-01-12 NOTE — Progress Notes (Signed)
 Cardiology Office Note:  .   Date:  01/12/2024  ID:  Richard Mclaughlin, DOB 11/25/1940, MRN [id number] PCP: Mylinda Lenis, MD  [facility] Providers Cardiologist:  Gordy Bergamo, MD   History of Present Illness: .   Richard Mclaughlin is a 83 y.o. Caucasian male patient with coronary disease, hyperglycemia/prediabetes mellitus with stage III chronic kidney disease, hypertension, hypercholesterolemia, OSA on CPAP.  Presented with late inferior and posterior STEMI and cardiac catheterization on 09/19/2023 revealing CTO RCA and CX with collaterals from LAD and proximal LAD had a 80% and D1 80 to 90% stenosis and recommended medical therapy in view of late presentation and EF 25 to 30% with consideration for revascularization of the LAD once stabilized from myocardial infarction.  He now presents for follow-up.  Patient has been experiencing recurrent episodes of angina pectoris hence rehab has been on hold.  He is accompanied by his wife.  Cardiac Studies relevent.    Echocardiogram 12/09/2023:  LV EF 45 to 50%.  Hypokinesis of the entire anterior wall and inferolateral wall.  Hypokinesis to akinesis of the basal inferior wall.  Global LV strain was -13.3%.  Grade 1 diastolic dysfunction. Mild aortic valve calcification. 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 Compared to 09/19/2023, EF is improved from 25%.   Cardiac Catheterization 09/19/23: Angiographic data: LV: Entire inferior and inferoapical akinesis.  LVEF 25 to 30%.  No significant mitral regurgitation. RCA: Occluded in the proximal segment.  Faint collaterals to the PDA from LAD. LM: Large-caliber vessel, normal. LAD: Diffusely diseased and mildly calcified throughout.  Proximal segment has a 80% stenosis.  Moderate-sized D-1 with ostial 80% to 90% stenosis small to moderate-sized D2 with mild 20% ostial stenosis. LCx: Occluded in the proximal segment.  OM1 which appears to be moderate to large size has faint ipsilateral  collaterals.  Distal CX not seen.      Impression and recommendations: Patient presenting late with inferior and posterior STEMI, essentially living off of LAD now.  LVEF appears to be severely reduced.  Will medically optimize him, started him on Entresto , Jardiance , Coreg  and discontinue metoprolol  tartrate.  Once optimized, if he has any recurrence of angina we will consider PCI to the LAD.  Discussed the use of AI scribe software for clinical note transcription with the patient, who gave verbal consent to proceed.  History of Present Illness Richard Mclaughlin is an 83 year old male with coronary artery disease and ischemic cardiomyopathy who presents with chest discomfort. He experiences chest discomfort during physical exertion, limiting his participation in cardiac rehabilitation. The discomfort includes lingering back pain. He has not used nitroglycerin  for these episodes.  He has coronary artery disease with multiple blocked arteries and a history of myocardial infarction in the right coronary artery distribution. Recent echocardiograms show improved heart function from 25% to 45-50%, with further confirmation pending from an upcoming echocardiogram with contrast.  He is on atorvastatin  80 mg for hypercholesterolemia and uses a CPAP machine for obstructive sleep apnea. His blood sugar levels are managed through diet, with recent levels around 6.4.   Labs   Care everywhere/Faxed External Labs:  Lipid profile 07/28/2023: Total cholesterol 253, triglycerides 145, HDL 55, LDL 169.  Lipoprotein (a)  Date/Time Value Ref Range Status  09/19/2023 06:33 AM 143.2 (H) <75.0 nmol/L Final    Comment:    (NOTE) Note:  Values greater than or equal to 75.0 nmol/L may       indicate an independent risk factor  for CHD,       but must be evaluated with caution when applied       to non-Caucasian populations due to the       influence of genetic factors on Lp(a) across        ethnicities. Performed At: [facility] [address] [place], [place] [id number] Jennette Shorter MD Ey:[id number]     Recent Labs    09/19/23 [phone] 09/20/23 0323 09/21/23 0348 10/09/23 1530  NA 134* 134* 134* 140  K 3.7 4.1 3.5 4.7  CL 102 101 100 103  CO2 22 20* 21* 22  GLUCOSE 152* 127* 115* 128*  BUN 17 26* 30* 20  CREATININE 1.18 1.51* 1.49* 1.30*  CALCIUM  9.6 9.3 9.1 9.9  GFRNONAA >60 46* 47*  --     Lab Results  Component Value Date   ALT 26 09/20/2023   AST 61 (H) 09/20/2023   ALKPHOS 43 09/20/2023   BILITOT 1.4 (H) 09/20/2023      Latest Ref Rng & Units 09/20/2023    3:23 AM 09/19/2023    2:53 AM 09/18/2023    5:36 PM  CBC  WBC 4.0 - 10.5 K/uL 8.9  10.6  12.0   Hemoglobin 13.0 - 17.0 g/dL 86.0  85.5  84.6   Hematocrit 39.0 - 52.0 % 40.1  41.0  43.3   Platelets 150 - 400 K/uL 169  187  202    Lab Results  Component Value Date   HGBA1C 6.4 (H) 09/19/2023    No results found for: TSH  ROS  Review of Systems  Cardiovascular:  Positive for chest pain. Negative for leg swelling.   Physical Exam:   VS:  BP 124/66 (BP Location: Left Arm, Patient Position: Sitting, Cuff Size: Normal)   Pulse 70   Resp 16   Ht 6' 1 (1.854 m)   Wt 191 lb (86.6 kg)   SpO2 95%   BMI 25.20 kg/m    Wt Readings from Last 3 Encounters:  01/12/24 191 lb (86.6 kg)  12/30/23 190 lb 7.6 oz (86.4 kg)  12/01/23 192 lb 9.6 oz (87.4 kg)    BP Readings from Last 3 Encounters:  01/12/24 124/66  12/30/23 (!) 96/58  12/01/23 114/60    Physical Exam Neck:     Vascular: No carotid bruit or JVD.  Cardiovascular:     Rate and Rhythm: Normal rate and regular rhythm.     Pulses: Intact distal pulses.     Heart sounds: Normal heart sounds. No murmur heard.    No gallop.  Pulmonary:     Effort: Pulmonary effort is normal.     Breath sounds: Normal breath sounds.  Abdominal:     General: Bowel sounds are normal.     Palpations: Abdomen is soft.  Musculoskeletal:     Right  lower leg: No edema.     Left lower leg: No edema.    EKG:    EKG Interpretation Date/Time:  Monday January 12 2024 16:35:55 EDT Ventricular Rate:  65 PR Interval:  190 QRS Duration:  114 QT Interval:  404 QTC Calculation: 420 R Axis:   18  Text Interpretation: EKG 01/12/2024: Normal sinus rhythm at rate of 65 bpm, normal axis, cannot exclude inferior infarct old.  Posterior infarct old.  LVH with repolarization abnormality.  Compared to 01/07/2024, nonspecific T abnormality in V6 is new, T wave flattening in the inferior leads is now replaced by T wave inversion. Confirmed by [place]ia Pfund, Jagadeesh (  [id number]) on 01/12/2024 4:46:41 PM    ASSESSMENT AND PLAN: .      ICD-10-CM   1. Coronary artery disease of native artery of native heart with stable angina pectoris (HCC)  I25.118 EKG 12-Lead    CBC    Comp Met (CMET)    Lipid Profile    2. Ischemic cardiomyopathy  I25.5 CBC    Comp Met (CMET)    3. Mixed hyperlipidemia  E78.2 Comp Met (CMET)    Lipid Profile    4. OSA (obstructive sleep apnea)  G47.33 CBC     Assessment & Plan Coronary artery disease with stable angina and multivessel coronary artery occlusion Coronary artery disease with angina characterized by chest discomfort and back pain during exertion.  Multivessel coronary artery occlusion with CTO RCA and CX and LAD with 80% blockage, and collateral circulation from the left anterior descending artery. Heart function improved but requires further intervention to prevent myocardial infarction. Decision pending on stent placement or bypass surgery after heart team discussion. - Present case to heart team to decide between stent placement or bypass surgery - Prescribe Imdur  30 mg once daily - Increase carvedilol  dose from 3.125 mg to 6.25 mg BID - Instruct to carry nitroglycerin  tablets and replace monthly - Advise to cease activities that provoke angina including cardiac rehab,  Ischemic cardiomyopathy with improved ejection  fraction Ischemic cardiomyopathy with previous ejection fraction of 25%, now improved to 45-50% as per recent echocardiogram. Echocardiogram with contrast scheduled to confirm improvement. - Perform echocardiogram with contrast to confirm ejection fraction improvement  Mixed hyperlipidemia on high-intensity statin therapy Mixed hyperlipidemia managed with atorvastatin  80 mg. Cholesterol levels need reassessment. - Order lipid profile - He was statin nave when he presented with acute STEMI late presentation, based on LDL 169.  Obstructive sleep apnea on CPAP therapy Obstructive sleep apnea managed with CPAP therapy.  Hyperglycemia (pre-diabetes) Hyperglycemia with recent HbA1c of 6.4%, indicating pre-diabetes. Managed with diet control. Although multiple mentions about diabetes mellitus-are made, do not see any A1c >6.5% hence erroneous diagnosis and patient only has hyperglycemia by definition.   Follow up: 1 month for follow-up of angina pectoris-CAD however decisions will be made earlier than 1 month follow-up regarding revascularization strategy.  Patient is wife present and all questions answered.  Signed,  Gordy Bergamo, MD, [facility] 01/12/2024, 5:31 PM [facility] [address] [place], [place] [address] Phone: [phone]. Fax:  [phone]

## 2024-01-12 NOTE — Patient Instructions (Addendum)
 Medication Instructions:  Your physician has recommended you make the following change in your medication: Start Isosorbide  mononitrate 30 mg by mouth daily  Increase Carvedilol  to 6.25 mg by mouth twice daily   *If you need a refill on your cardiac medications before your next appointment, please call your pharmacy*  Lab Work: Have fasting lab work done tomorrow. (CMET, CBC, Lipids) Can be done at any [facility] location.  There is an office on the first floor of our building If you have labs (blood work) drawn today and your tests are completely normal, you will receive your results only by: MyChart Message (if you have MyChart) OR A paper copy in the mail If you have any lab test that is abnormal or we need to change your treatment, we will call you to review the results.  Testing/Procedures: none  Follow-Up: At [facility], you and your health needs are our priority.  As part of our continuing mission to provide you with exceptional heart care, our providers are all part of one team.  This team includes your primary Cardiologist (physician) and Advanced Practice Providers or APPs (Physician Assistants and Nurse Practitioners) who all work together to provide you with the care you need, when you need it.  Your next appointment:   September 11   Provider:   Dr Elba no MD populates, click here to update Cardiologist or EP   DO NOT delete brackets or number around this link :1}   We recommend signing up for the patient portal called MyChart.  Sign up information is provided on this After Visit Summary.  MyChart is used to connect with patients for Virtual Visits (Telemedicine).  Patients are able to view lab/test results, encounter notes, upcoming appointments, etc.  Non-urgent messages can be sent to your provider as well.   To learn more about what you can do with MyChart, go to [email or web address].   Other Instructions

## 2024-01-14 ENCOUNTER — Encounter (HOSPITAL_COMMUNITY): Admission: RE | Admit: 2024-01-14 | Source: Ambulatory Visit

## 2024-01-14 ENCOUNTER — Ambulatory Visit: Payer: Self-pay | Admitting: Cardiology

## 2024-01-14 ENCOUNTER — Telehealth (HOSPITAL_COMMUNITY): Payer: Self-pay | Admitting: *Deleted

## 2024-01-14 LAB — COMPREHENSIVE METABOLIC PANEL WITH GFR
ALT: 18 IU/L (ref 0–44)
AST: 22 IU/L (ref 0–40)
Albumin: 4.3 g/dL (ref 3.7–4.7)
Alkaline Phosphatase: 74 IU/L (ref 44–121)
BUN/Creatinine Ratio: 14 (ref 10–24)
BUN: 18 mg/dL (ref 8–27)
Bilirubin Total: 0.4 mg/dL (ref 0.0–1.2)
CO2: 21 mmol/L (ref 20–29)
Calcium: 9.6 mg/dL (ref 8.6–10.2)
Chloride: 105 mmol/L (ref 96–106)
Creatinine, Ser: 1.32 mg/dL — ABNORMAL HIGH (ref 0.76–1.27)
Globulin, Total: 2.1 g/dL (ref 1.5–4.5)
Glucose: 119 mg/dL — ABNORMAL HIGH (ref 70–99)
Potassium: 5 mmol/L (ref 3.5–5.2)
Sodium: 143 mmol/L (ref 134–144)
Total Protein: 6.4 g/dL (ref 6.0–8.5)
eGFR: 54 mL/min/1.73 — ABNORMAL LOW (ref >59)

## 2024-01-14 LAB — LIPID PANEL
Chol/HDL Ratio: 3.5 ratio (ref 0.0–5.0)
Cholesterol, Total: 122 mg/dL (ref 100–199)
HDL: 35 mg/dL — ABNORMAL LOW (ref >39)
LDL Chol Calc (NIH): 59 mg/dL (ref 0–99)
Triglycerides: 166 mg/dL — ABNORMAL HIGH (ref 0–149)
VLDL Cholesterol Cal: 28 mg/dL (ref 5–40)

## 2024-01-14 LAB — CBC
Hematocrit: 43.9 % (ref 37.5–51.0)
Hemoglobin: 14.6 g/dL (ref 13.0–17.7)
MCH: 32.7 pg (ref 26.6–33.0)
MCHC: 33.3 g/dL (ref 31.5–35.7)
MCV: 98 fL — ABNORMAL HIGH (ref 79–97)
Platelets: 170 x10E3/uL (ref 150–450)
RBC: 4.46 x10E6/uL (ref 4.14–5.80)
RDW: 12.4 % (ref 11.6–15.4)
WBC: 5.4 x10E3/uL (ref 3.4–10.8)

## 2024-01-14 NOTE — Telephone Encounter (Signed)
 Spoke with [name] exercise is on hold per Dr Ladona. Spoke with the patient and his wife. Will cancel appointments until he sees Dr Ladona in September. Will place on medical hold.Hadassah Elpidio Quan RN BSN

## 2024-01-14 NOTE — Progress Notes (Signed)
 Cholesterol is now well controlled. Need to decrease starch intake as triglycerides are still high. Renal function stable. Normal blood count

## 2024-01-16 ENCOUNTER — Encounter (HOSPITAL_COMMUNITY)

## 2024-01-19 ENCOUNTER — Encounter (HOSPITAL_COMMUNITY)

## 2024-01-21 ENCOUNTER — Encounter (HOSPITAL_COMMUNITY)

## 2024-01-22 ENCOUNTER — Ambulatory Visit (HOSPITAL_COMMUNITY)

## 2024-01-23 ENCOUNTER — Encounter (HOSPITAL_COMMUNITY)

## 2024-01-26 ENCOUNTER — Encounter (HOSPITAL_COMMUNITY)

## 2024-01-27 ENCOUNTER — Ambulatory Visit (HOSPITAL_COMMUNITY)
Admission: RE | Admit: 2024-01-27 | Discharge: 2024-01-27 | Disposition: A | Discharge status: Home / Self Care | Source: Ambulatory Visit | Attending: Family | Admitting: Family

## 2024-01-27 DIAGNOSIS — I255 Ischemic cardiomyopathy: Secondary | ICD-10-CM | POA: Insufficient documentation

## 2024-01-27 LAB — ECHOCARDIOGRAM LIMITED

## 2024-01-27 MED ORDER — PERFLUTREN LIPID MICROSPHERE
1.0000 mL | INTRAVENOUS | Status: AC | PRN
  Administered 2024-01-27 (×2): 2 mL via INTRAVENOUS

## 2024-01-28 ENCOUNTER — Encounter (HOSPITAL_COMMUNITY)

## 2024-01-28 ENCOUNTER — Other Ambulatory Visit: Payer: Self-pay | Admitting: *Deleted

## 2024-01-28 ENCOUNTER — Ambulatory Visit (HOSPITAL_BASED_OUTPATIENT_CLINIC_OR_DEPARTMENT_OTHER): Payer: Self-pay | Admitting: Family

## 2024-01-28 DIAGNOSIS — Z01812 Encounter for preprocedural laboratory examination: Secondary | ICD-10-CM

## 2024-01-28 DIAGNOSIS — I255 Ischemic cardiomyopathy: Secondary | ICD-10-CM

## 2024-01-28 NOTE — Progress Notes (Signed)
 Agree and if viable may be a candidate for CABG or one vessel PCI

## 2024-01-30 ENCOUNTER — Encounter (HOSPITAL_COMMUNITY)

## 2024-02-10 ENCOUNTER — Encounter (HOSPITAL_COMMUNITY): Payer: Self-pay | Admitting: *Deleted

## 2024-02-10 DIAGNOSIS — I2111 ST elevation (STEMI) myocardial infarction involving right coronary artery: Secondary | ICD-10-CM

## 2024-02-10 NOTE — Progress Notes (Signed)
 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 Medical Therapy  Patient's Home Medications on Admission:  Current Outpatient Medications:    acetaminophen  (TYLENOL ) 500 MG tablet, Take 500-1,000 mg by mouth every 6 (six) hours as needed for moderate pain (pain score 4-6)., Disp: , Rfl:    aspirin  EC 81 MG tablet, Take 81 mg by mouth daily., Disp: , Rfl:    atorvastatin  (LIPITOR ) 80 MG tablet, Take 1 tablet (80 mg total) by mouth daily., Disp: 90 tablet, Rfl: 3   carvedilol  (COREG ) 6.25 MG tablet, Take 1 tablet (6.25 mg total) by mouth 2 (two) times daily., Disp: 60 tablet, Rfl: 3   cetirizine (ZYRTEC) 10 MG tablet, Take 10 mg by mouth daily., Disp: , Rfl:    clopidogrel  (PLAVIX ) 75 MG tablet, Take 1 tablet (75 mg total) by mouth daily., Disp: 90 tablet, Rfl: 3   Cyanocobalamin (B-12 PO), Take 1 tablet by mouth daily., Disp: , Rfl:    ezetimibe  (ZETIA ) 10 MG tablet, Take 1 tablet (10 mg total) by mouth daily., Disp: 90 tablet, Rfl: 3   isosorbide  mononitrate (IMDUR ) 30 MG 24 hr tablet, Take 1 tablet (30 mg total) by mouth daily., Disp: 30 tablet, Rfl: 3   Multiple Vitamin (MULTIVITAMIN WITH MINERALS) TABS tablet, Take 1 tablet by mouth daily., Disp: , Rfl:    Multiple Vitamins-Minerals (ZINC PO), Take 1 tablet by mouth daily as needed (when starting to feel sick.)., Disp: , Rfl:    nitroGLYCERIN  (NITROSTAT ) 0.4 MG SL tablet, Place 1 tablet (0.4 mg total) under the tongue every 5 (five) minutes x 3 doses as  needed for chest pain., Disp: 10 tablet, Rfl: 12   sacubitril -valsartan  (ENTRESTO ) 24-26 MG, Take 1 tablet by mouth 2 (two) times daily., Disp: 60 tablet, Rfl: 11   VITAMIN D PO, Take 1 tablet by mouth daily., Disp: , Rfl:   Past Medical History: Past Medical History:  Diagnosis Date   CKD stage 3a, GFR 45-59 ml/min (HCC) 09/19/2023   High cholesterol    Hypertension 09/19/2023   Non-insulin  dependent type 2 diabetes mellitus (HCC) 09/19/2023   OSA (obstructive sleep apnea) 09/19/2023    Tobacco Use: Social History   Tobacco Use  Smoking Status Former   Current packs/day: 0.00   Types: Cigarettes   Quit date: 06/04/1983   Years since quitting: 40.7  Smokeless Tobacco Not on file    Labs: Review Flowsheet       Latest Ref Rng & Units 09/19/2023 01/13/2024  Labs for ITP Cardiac and Pulmonary Rehab  Cholestrol 100 - 199 mg/dL - 877   LDL (calc) 0 - 99 mg/dL - 59   HDL-C >60 mg/dL - 35   Trlycerides 0 - 149 mg/dL - 833   Hemoglobin J8r 4.8 - 5.6 % 6.4  -    Capillary Blood Glucose: Lab Results  Component Value Date   GLUCAP 129 (H) 09/21/2023   GLUCAP 119 (H) 09/21/2023   GLUCAP 141 (H) 09/20/2023   GLUCAP 178 (H) 09/20/2023   GLUCAP 126 (H) 09/20/2023  Exercise Target Goals: Exercise Program Goal: Individual exercise prescription set using results from initial 6 min walk test and THRR while considering  patient's activity barriers and safety.   Exercise Prescription Goal: Initial exercise prescription builds to 30-45 minutes a day of aerobic activity, 2-3 days per week.  Home exercise guidelines will be given to patient during program as part of exercise prescription that the participant will acknowledge.  Activity Barriers & Risk Stratification:  Activity Barriers & Cardiac Risk Stratification - 12/30/23 1449       Activity Barriers & Cardiac Risk Stratification   Activity Barriers Balance Concerns;Joint Problems;Deconditioning;Arthritis;Decreased  Ventricular Function    Cardiac Risk Stratification High          6 Minute Walk:  6 Minute Walk     Row Name 12/30/23 1430         6 Minute Walk   Phase Initial     Distance 1315 feet     Walk Time 6 minutes     # of Rest Breaks 0     MPH 2.5     METS 2.3     RPE 11     Perceived Dyspnea  0     VO2 Peak 7.8     Symptoms No     Resting HR 69 bpm     Resting BP 96/68     Resting Oxygen Saturation  96 %     Exercise Oxygen Saturation  during 6 min walk 96 %     Max Ex. HR 84 bpm     Max Ex. BP 108/66     2 Minute Post BP 100/60        Oxygen Initial Assessment:   Oxygen Re-Evaluation:   Oxygen Discharge (Final Oxygen Re-Evaluation):   Initial Exercise Prescription:  Initial Exercise Prescription - 12/30/23 1500       Date of Initial Exercise RX and Referring Provider   Date 12/30/23    Referring Provider Ganji    Expected Discharge Date 03/24/24      Bike   Level 1    Watts 50    Minutes 15    METs 2      Track   Minutes 15    METs 2      Prescription Details   Frequency (times per week) 3    Duration Progress to 30 minutes of continuous aerobic without signs/symptoms of physical distress      Intensity   THRR 40-80% of Max Heartrate 55-110    Ratings of Perceived Exertion 11-13    Perceived Dyspnea 0-4      Progression   Progression Continue to progress workloads to maintain intensity without signs/symptoms of physical distress.      Resistance Training   Training Prescription Yes    Weight 3 lbs    Reps 10-15          Perform Capillary Blood Glucose checks as needed.  Exercise Prescription Changes:   Exercise Prescription Changes     Row Name 01/05/24 1638             Response to Exercise   Blood Pressure (Admit) 98/60       Blood Pressure (Exercise) 140/82       Blood Pressure (Exit) 142/80       Heart Rate (Admit) 69 bpm       Heart Rate (Exercise) 88 bpm       Heart Rate (Exit) 68 bpm  Rating of Perceived  Exertion (Exercise) 13       Perceived Dyspnea (Exercise) 0       Symptoms Chest pain during walking. 4/10. Resolved with rest. On site provider and nurse aware. Seenote in epic       Comments Pt first day in the CRP2 Pritikin ICR program       Duration Progress to 30 minutes of  aerobic without signs/symptoms of physical distress       Intensity THRR unchanged         Progression   Progression Continue to progress workloads to maintain intensity without signs/symptoms of physical distress.       Average METs 2.4         Resistance Training   Training Prescription Yes       Weight 3 lbs       Reps 10-15       Time 10 Minutes         Track   Laps 11       Minutes 10  CP after 10 min of walking the track. See nurse note in EPIC       METs 2          Exercise Comments:   Exercise Comments     Row Name 01/05/24 1646           Exercise Comments Pt first day in the Pritikin ICR program. Pt tolerated exercise fair symptomatic with CP while walking the track, resolved with rest. See nurse note in EPIC. Cleared to return to exercise. Average MET's 2.4. Will continue to moniutor pt          Exercise Goals and Review:   Exercise Goals     Row Name 12/30/23 1449             Exercise Goals   Increase Physical Activity Yes       Intervention Provide advice, education, support and counseling about physical activity/exercise needs.;Develop an individualized exercise prescription for aerobic and resistive training based on initial evaluation findings, risk stratification, comorbidities and participant's personal goals.       Expected Outcomes Short Term: Attend rehab on a regular basis to increase amount of physical activity.;Long Term: Exercising regularly at least 3-5 days a week.;Long Term: Add in home exercise to make exercise part of routine and to increase amount of physical activity.       Increase Strength and Stamina Yes       Intervention Provide advice, education,  support and counseling about physical activity/exercise needs.;Develop an individualized exercise prescription for aerobic and resistive training based on initial evaluation findings, risk stratification, comorbidities and participant's personal goals.       Expected Outcomes Short Term: Increase workloads from initial exercise prescription for resistance, speed, and METs.;Short Term: Perform resistance training exercises routinely during rehab and add in resistance training at home;Long Term: Improve cardiorespiratory fitness, muscular endurance and strength as measured by increased METs and functional capacity ( )       Able to understand and use rate of perceived exertion (RPE) scale Yes       Intervention Provide education and explanation on how to use RPE scale       Expected Outcomes Short Term: Able to use RPE daily in rehab to express subjective intensity level;Long Term:  Able to use RPE to guide intensity level when exercising independently       Knowledge and understanding of Target Heart Rate Range (THRR) Yes  Intervention Provide education and explanation of THRR including how the numbers were predicted and where they are located for reference       Expected Outcomes Long Term: Able to use THRR to govern intensity when exercising independently;Short Term: Able to state/look up THRR;Short Term: Able to use daily as guideline for intensity in rehab       Understanding of Exercise Prescription Yes       Intervention Provide education, explanation, and written materials on patient's individual exercise prescription       Expected Outcomes Short Term: Able to explain program exercise prescription;Long Term: Able to explain home exercise prescription to exercise independently          Exercise Goals Re-Evaluation :  Exercise Goals Re-Evaluation     Row Name 01/05/24 1642             Exercise Goal Re-Evaluation   Exercise Goals Review Increase Physical Activity;Increase Strength  and Stamina;Able to understand and use rate of perceived exertion (RPE) scale;Knowledge and understanding of Target Heart Rate Range (THRR);Understanding of Exercise Prescription       Comments Pt first day in the Pritikin ICR program. Pt tolerated exercise fair symptomatic with CP while walking the track, resolved with rest. See nurse note in EPIC. Cleared to return to exercise. Average MET's 2.4. Will continue to moniutor pt       Expected Outcomes CP symptoms will resolve and can continue to exercise without symptoms          Discharge Exercise Prescription (Final Exercise Prescription Changes):  Exercise Prescription Changes - 01/05/24 1638       Response to Exercise   Blood Pressure (Admit) 98/60    Blood Pressure (Exercise) 140/82    Blood Pressure (Exit) 142/80    Heart Rate (Admit) 69 bpm    Heart Rate (Exercise) 88 bpm    Heart Rate (Exit) 68 bpm    Rating of Perceived Exertion (Exercise) 13    Perceived Dyspnea (Exercise) 0    Symptoms Chest pain during walking. 4/10. Resolved with rest. On site provider and nurse aware. Seenote in epic    Comments Pt first day in the CRP2 Pritikin ICR program    Duration Progress to 30 minutes of  aerobic without signs/symptoms of physical distress    Intensity THRR unchanged      Progression   Progression Continue to progress workloads to maintain intensity without signs/symptoms of physical distress.    Average METs 2.4      Resistance Training   Training Prescription Yes    Weight 3 lbs    Reps 10-15    Time 10 Minutes      Track   Laps 11    Minutes 10   CP after 10 min of walking the track. See nurse note in EPIC   METs 2          Nutrition:  Target Goals: Understanding of nutrition guidelines, daily intake of sodium 1500mg , cholesterol 200mg , calories 30% from fat and 7% or less from saturated fats, daily to have 5 or more servings of fruits and vegetables.  Biometrics:  Pre Biometrics - 12/30/23 1345       Pre  Biometrics   Waist Circumference 39.5 inches    Hip Circumference 42 inches    Waist to Hip Ratio 0.94 %    Triceps Skinfold 13 mm    % Body Fat 25.8 %    Grip Strength 38 kg    Flexibility  0 in   COULD NOT REACH   Single Leg Stand 7.18 seconds           Nutrition Therapy Plan and Nutrition Goals:  Nutrition Therapy & Goals - 01/06/24 1442       Nutrition Therapy   Diet Heart Healthy Diet      Personal Nutrition Goals   Nutrition Goal Patient to identify strategies for reducing cardiovascular risk by attending the Pritikin education and nutrition series weekly.    Personal Goal #2 Patient to improve diet quality by using the plate method as a guide for meal planning to include lean protein/plant protein, fruits, vegetables, whole grains, nonfat dairy as part of a well-balanced diet.    Comments Richard Mclaughlin has medical history of STEMI, Cardiomyopathy, HTN, hyperlipidemia, CHF, OSA,DM2. LDL has improved but not yet at goal of <55; continues on zetia , lipitor . A1c is well controlled at goal. Patient will benefit from participation in intensive cardiac rehab for nutrition education, exercise, and lifestyle modification.      Intervention Plan   Intervention Prescribe, educate and counsel regarding individualized specific dietary modifications aiming towards targeted core components such as weight, hypertension, lipid management, diabetes, heart failure and other comorbidities.;Nutrition handout(s) given to patient.    Expected Outcomes Short Term Goal: Understand basic principles of dietary content, such as calories, fat, sodium, cholesterol and nutrients.;Long Term Goal: Adherence to prescribed nutrition plan.          Nutrition Assessments:  Nutrition Assessments - 01/09/24 1531       Rate Your Plate Scores   Pre Score 45         MEDIFICTS Score Key: >=70 Need to make dietary changes  40-70 Heart Healthy Diet <= 40 Therapeutic Level Cholesterol Diet   Flowsheet Row INTENSIVE  CARDIAC REHAB from 01/07/2024 in [facility]  Picture Your Plate Total Score on Admission 45   Picture Your Plate Scores: <59 Unhealthy dietary pattern with much room for improvement. 41-50 Dietary pattern unlikely to meet recommendations for good health and room for improvement. 51-60 More healthful dietary pattern, with some room for improvement.  >60 Healthy dietary pattern, although there may be some specific behaviors that could be improved.    Nutrition Goals Re-Evaluation:  Nutrition Goals Re-Evaluation     Row Name 01/06/24 1442             Goals   Current Weight 191 lb 2.2 oz (86.7 kg)       Comment LDL 85. Cr 1.27, GFR 56, Lpa 143, A1c 6.4       Expected Outcome Richard Mclaughlin has medical history of STEMI, Cardiomyopathy, HTN, hyperlipidemia, CHF, OSA,DM2. LDL has improved but not yet at goal of <55; continues on zetia , lipitor . A1c is well controlled at goal. Patient will benefit from participation in intensive cardiac rehab for nutrition education, exercise, and lifestyle modification.          Nutrition Goals Re-Evaluation:  Nutrition Goals Re-Evaluation     Row Name 01/06/24 1442             Goals   Current Weight 191 lb 2.2 oz (86.7 kg)       Comment LDL 85. Cr 1.27, GFR 56, Lpa 143, A1c 6.4       Expected Outcome Richard Mclaughlin has medical history of STEMI, Cardiomyopathy, HTN, hyperlipidemia, CHF, OSA,DM2. LDL has improved but not yet at goal of <55; continues on zetia , lipitor . A1c is well controlled  at goal. Patient will benefit from participation in intensive cardiac rehab for nutrition education, exercise, and lifestyle modification.          Nutrition Goals Discharge (Final Nutrition Goals Re-Evaluation):  Nutrition Goals Re-Evaluation - 01/06/24 1442       Goals   Current Weight 191 lb 2.2 oz (86.7 kg)    Comment LDL 85. Cr 1.27, GFR 56, Lpa 143, A1c 6.4    Expected Outcome Richard Mclaughlin has medical history of STEMI,  Cardiomyopathy, HTN, hyperlipidemia, CHF, OSA,DM2. LDL has improved but not yet at goal of <55; continues on zetia , lipitor . A1c is well controlled at goal. Patient will benefit from participation in intensive cardiac rehab for nutrition education, exercise, and lifestyle modification.          Psychosocial: Target Goals: Acknowledge presence or absence of significant depression and/or stress, maximize coping skills, provide positive support system. Participant is able to verbalize types and ability to use techniques and skills needed for reducing stress and depression.  Initial Review & Psychosocial Screening:  Initial Psych Review & Screening - 12/30/23 1533       Initial Review   Current issues with None Identified      Family Dynamics   Good Support System? Yes   Pt has good support system with his wife     Barriers   Psychosocial barriers to participate in program There are no identifiable barriers or psychosocial needs.      Screening Interventions   Interventions Encouraged to exercise    Expected Outcomes Long Term goal: The participant improves quality of Life and PHQ9 Scores as seen by post scores and/or verbalization of changes          Quality of Life Scores:  Quality of Life - 12/30/23 1455       Quality of Life   Select Quality of Life      Quality of Life Scores   Health/Function Pre 28 %    Socioeconomic Pre 30 %    Psych/Spiritual Pre 30 %    Family Post 28.8 %    GLOBAL Pre 29.31 %         Scores of 19 and below usually indicate a poorer quality of life in these areas.  A difference of  2-3 points is a clinically meaningful difference.  A difference of 2-3 points in the total score of the Quality of Life Index has been associated with significant improvement in overall quality of life, self-image, physical symptoms, and general health in studies assessing change in quality of life.  PHQ-9: Review Flowsheet       12/30/2023  Depression screen PHQ  2/9  Decreased Interest 0  Down, Depressed, Hopeless 0  PHQ - 2 Score 0  Altered sleeping 0  Tired, decreased energy 0  Change in appetite 0  Feeling bad or failure about yourself  0  Trouble concentrating 0  Moving slowly or fidgety/restless 0  Suicidal thoughts 0  PHQ-9 Score 0   Interpretation of Total Score  Total Score Depression Severity:  1-4 = Minimal depression, 5-9 = Mild depression, 10-14 = Moderate depression, 15-19 = Moderately severe depression, 20-27 = Severe depression   Psychosocial Evaluation and Intervention:   Psychosocial Re-Evaluation:  Psychosocial Re-Evaluation     Row Name 01/08/24 0841 02/10/24 1434           Psychosocial Re-Evaluation   Current issues with Current Stress Concerns Current Stress Concerns      Comments Exercise is currently  on hold Exercise remains on hold      Continue Psychosocial Services  Follow up required by staff Follow up required by staff        Initial Review   Source of Stress Concerns Chronic Illness Chronic Illness      Comments Will reevaluate when patient is cleared to return to group exercise at cardiac rehab. Will reevaluate when patient is cleared to return to group exercise at cardiac rehab.         Psychosocial Discharge (Final Psychosocial Re-Evaluation):  Psychosocial Re-Evaluation - 02/10/24 1434       Psychosocial Re-Evaluation   Current issues with Current Stress Concerns    Comments Exercise remains on hold    Continue Psychosocial Services  Follow up required by staff      Initial Review   Source of Stress Concerns Chronic Illness    Comments Will reevaluate when patient is cleared to return to group exercise at cardiac rehab.          Vocational Rehabilitation: Provide vocational rehab assistance to qualifying candidates.   Vocational Rehab Evaluation & Intervention:  Vocational Rehab - 12/30/23 1455       Initial Vocational Rehab Evaluation & Intervention   Assessment shows need for  Vocational Rehabilitation No   Pt is retired         Education: Education Goals: Education classes will be provided on a weekly basis, covering required topics. Participant will state understanding/return demonstration of topics presented.    Education     Row Name 01/05/24 1400     Education   Cardiac Education Topics Pritikin   Glass blower/designer Nutrition   Nutrition Workshop Label Reading   Instruction Review Code 1- Teaching laboratory technician Start Time 1400   Class Stop Time 1450   Class Time Calculation (min) 50 min    Row Name 01/07/24 1500     Education   Cardiac Education Topics Pritikin   Customer service manager   Weekly Topic Fast and Healthy Breakfasts   Instruction Review Code 1- Verbalizes Understanding   Class Start Time 1400   Class Stop Time 1440   Class Time Calculation (min) 40 min      Core Videos: Exercise    Move It!  Clinical staff conducted group or individual video education with verbal and written material and guidebook.  Patient learns the recommended Pritikin exercise program. Exercise with the goal of living a long, healthy life. Some of the health benefits of exercise include controlled diabetes, healthier blood pressure levels, improved cholesterol levels, improved heart and lung capacity, improved sleep, and better body composition. Everyone should speak with their doctor before starting or changing an exercise routine.  Biomechanical Limitations Clinical staff conducted group or individual video education with verbal and written material and guidebook.  Patient learns how biomechanical limitations can impact exercise and how we can mitigate and possibly overcome limitations to have an impactful and balanced exercise routine.  Body Composition Clinical staff conducted group or individual video education with verbal and written material and  guidebook.  Patient learns that body composition (ratio of muscle mass to fat mass) is a key component to assessing overall fitness, rather than body weight alone. Increased fat mass, especially visceral belly fat, can put us  at increased risk for metabolic syndrome, type 2 diabetes, heart disease, and even death. It  is recommended to combine diet and exercise (cardiovascular and resistance training) to improve your body composition. Seek guidance from your physician and exercise physiologist before implementing an exercise routine.  Exercise Action Plan Clinical staff conducted group or individual video education with verbal and written material and guidebook.  Patient learns the recommended strategies to achieve and enjoy long-term exercise adherence, including variety, self-motivation, self-efficacy, and positive decision making. Benefits of exercise include fitness, good health, weight management, more energy, better sleep, less stress, and overall well-being.  Medical   Heart Disease Risk Reduction Clinical staff conducted group or individual video education with verbal and written material and guidebook.  Patient learns our heart is our most vital organ as it circulates oxygen, nutrients, white blood cells, and hormones throughout the entire body, and carries waste away. Data supports a plant-based eating plan like the Pritikin Program for its effectiveness in slowing progression of and reversing heart disease. The video provides a number of recommendations to address heart disease.   Metabolic Syndrome and Belly Fat  Clinical staff conducted group or individual video education with verbal and written material and guidebook.  Patient learns what metabolic syndrome is, how it leads to heart disease, and how one can reverse it and keep it from coming back. You have metabolic syndrome if you have 3 of the following 5 criteria: abdominal obesity, high blood pressure, high triglycerides, low HDL  cholesterol, and high blood sugar.  Hypertension and Heart Disease Clinical staff conducted group or individual video education with verbal and written material and guidebook.  Patient learns that high blood pressure, or hypertension, is very common in the [place] . Hypertension is largely due to excessive salt intake, but other important risk factors include being overweight, physical inactivity, drinking too much alcohol, smoking, and not eating enough potassium from fruits and vegetables. High blood pressure is a leading risk factor for heart attack, stroke, congestive heart failure, dementia, kidney failure, and premature death. Long-term effects of excessive salt intake include stiffening of the arteries and thickening of heart muscle and organ damage. Recommendations include ways to reduce hypertension and the risk of heart disease.  Diseases of Our Time - Focusing on Diabetes Clinical staff conducted group or individual video education with verbal and written material and guidebook.  Patient learns why the best way to stop diseases of our time is prevention, through food and other lifestyle changes. Medicine (such as prescription pills and surgeries) is often only a Band-Aid on the problem, not a long-term solution. Most common diseases of our time include obesity, type 2 diabetes, hypertension, heart disease, and cancer. The Pritikin Program is recommended and has been proven to help reduce, reverse, and/or prevent the damaging effects of metabolic syndrome.  Nutrition   Overview of the Pritikin Eating Plan  Clinical staff conducted group or individual video education with verbal and written material and guidebook.  Patient learns about the Pritikin Eating Plan for disease risk reduction. The Pritikin Eating Plan emphasizes a wide variety of unrefined, minimally-processed carbohydrates, like fruits, vegetables, whole grains, and legumes. Go, Caution, and Stop food choices are explained.  Plant-based and lean animal proteins are emphasized. Rationale provided for low sodium intake for blood pressure control, low added sugars for blood sugar stabilization, and low added fats and oils for coronary artery disease risk reduction and weight management.  Calorie Density  Clinical staff conducted group or individual video education with verbal and written material and guidebook.  Patient learns about calorie density and how it  impacts the Pritikin Eating Plan. Knowing the characteristics of the food you choose will help you decide whether those foods will lead to weight gain or weight loss, and whether you want to consume more or less of them. Weight loss is usually a side effect of the Pritikin Eating Plan because of its focus on low calorie-dense foods.  Label Reading  Clinical staff conducted group or individual video education with verbal and written material and guidebook.  Patient learns about the Pritikin recommended label reading guidelines and corresponding recommendations regarding calorie density, added sugars, sodium content, and whole grains.  Dining Out - Part 1  Clinical staff conducted group or individual video education with verbal and written material and guidebook.  Patient learns that restaurant meals can be sabotaging because they can be so high in calories, fat, sodium, and/or sugar. Patient learns recommended strategies on how to positively address this and avoid unhealthy pitfalls.  Facts on Fats  Clinical staff conducted group or individual video education with verbal and written material and guidebook.  Patient learns that lifestyle modifications can be just as effective, if not more so, as many medications for lowering your risk of heart disease. A Pritikin lifestyle can help to reduce your risk of inflammation and atherosclerosis (cholesterol build-up, or plaque, in the artery walls). Lifestyle interventions such as dietary choices and physical activity address  the cause of atherosclerosis. A review of the types of fats and their impact on blood cholesterol levels, along with dietary recommendations to reduce fat intake is also included.  Nutrition Action Plan  Clinical staff conducted group or individual video education with verbal and written material and guidebook.  Patient learns how to incorporate Pritikin recommendations into their lifestyle. Recommendations include planning and keeping personal health goals in mind as an important part of their success.  Healthy Mind-Set    Healthy Minds, Bodies, Hearts  Clinical staff conducted group or individual video education with verbal and written material and guidebook.  Patient learns how to identify when they are stressed. Video will discuss the impact of that stress, as well as the many benefits of stress management. Patient will also be introduced to stress management techniques. The way we think, act, and feel has an impact on our hearts.  How Our Thoughts Can Heal Our Hearts  Clinical staff conducted group or individual video education with verbal and written material and guidebook.  Patient learns that negative thoughts can cause depression and anxiety. This can result in negative lifestyle behavior and serious health problems. Cognitive behavioral therapy is an effective method to help control our thoughts in order to change and improve our emotional outlook.  Additional Videos:  Exercise    Improving Performance  Clinical staff conducted group or individual video education with verbal and written material and guidebook.  Patient learns to use a non-linear approach by alternating intensity levels and lengths of time spent exercising to help burn more calories and lose more body fat. Cardiovascular exercise helps improve heart health, metabolism, hormonal balance, blood sugar control, and recovery from fatigue. Resistance training improves strength, endurance, balance, coordination, reaction time,  metabolism, and muscle mass. Flexibility exercise improves circulation, posture, and balance. Seek guidance from your physician and exercise physiologist before implementing an exercise routine and learn your capabilities and proper form for all exercise.  Introduction to Yoga  Clinical staff conducted group or individual video education with verbal and written material and guidebook.  Patient learns about yoga, a discipline of the coming together of  mind, breath, and body. The benefits of yoga include improved flexibility, improved range of motion, better posture and core strength, increased lung function, weight loss, and positive self-image. Yoga's heart health benefits include lowered blood pressure, healthier heart rate, decreased cholesterol and triglyceride levels, improved immune function, and reduced stress. Seek guidance from your physician and exercise physiologist before implementing an exercise routine and learn your capabilities and proper form for all exercise.  Medical   Aging: Enhancing Your Quality of Life  Clinical staff conducted group or individual video education with verbal and written material and guidebook.  Patient learns key strategies and recommendations to stay in good physical health and enhance quality of life, such as prevention strategies, having an advocate, securing a Health Care Proxy and Power of Attorney, and keeping a list of medications and system for tracking them. It also discusses how to avoid risk for bone loss.  Biology of Weight Control  Clinical staff conducted group or individual video education with verbal and written material and guidebook.  Patient learns that weight gain occurs because we consume more calories than we burn (eating more, moving less). Even if your body weight is normal, you may have higher ratios of fat compared to muscle mass. Too much body fat puts you at increased risk for cardiovascular disease, heart attack, stroke, type 2  diabetes, and obesity-related cancers. In addition to exercise, following the Pritikin Eating Plan can help reduce your risk.  Decoding Lab Results  Clinical staff conducted group or individual video education with verbal and written material and guidebook.  Patient learns that lab test reflects one measurement whose values change over time and are influenced by many factors, including medication, stress, sleep, exercise, food, hydration, pre-existing medical conditions, and more. It is recommended to use the knowledge from this video to become more involved with your lab results and evaluate your numbers to speak with your doctor.   Diseases of Our Time - Overview  Clinical staff conducted group or individual video education with verbal and written material and guidebook.  Patient learns that according to the CDC, 50% to 70% of chronic diseases (such as obesity, type 2 diabetes, elevated lipids, hypertension, and heart disease) are avoidable through lifestyle improvements including healthier food choices, listening to satiety cues, and increased physical activity.  Sleep Disorders Clinical staff conducted group or individual video education with verbal and written material and guidebook.  Patient learns how good quality and duration of sleep are important to overall health and well-being. Patient also learns about sleep disorders and how they impact health along with recommendations to address them, including discussing with a physician.  Nutrition  Dining Out - Part 2 Clinical staff conducted group or individual video education with verbal and written material and guidebook.  Patient learns how to plan ahead and communicate in order to maximize their dining experience in a healthy and nutritious manner. Included are recommended food choices based on the type of restaurant the patient is visiting.   Fueling a Banker conducted group or individual video education with verbal  and written material and guidebook.  There is a strong connection between our food choices and our health. Diseases like obesity and type 2 diabetes are very prevalent and are in large-part due to lifestyle choices. The Pritikin Eating Plan provides plenty of food and hunger-curbing satisfaction. It is easy to follow, affordable, and helps reduce health risks.  Menu Workshop  Clinical staff conducted group or individual video education with  verbal and written material and guidebook.  Patient learns that restaurant meals can sabotage health goals because they are often packed with calories, fat, sodium, and sugar. Recommendations include strategies to plan ahead and to communicate with the manager, chef, or server to help order a healthier meal.  Planning Your Eating Strategy  Clinical staff conducted group or individual video education with verbal and written material and guidebook.  Patient learns about the Pritikin Eating Plan and its benefit of reducing the risk of disease. The Pritikin Eating Plan does not focus on calories. Instead, it emphasizes high-quality, nutrient-rich foods. By knowing the characteristics of the foods, we choose, we can determine their calorie density and make informed decisions.  Targeting Your Nutrition Priorities  Clinical staff conducted group or individual video education with verbal and written material and guidebook.  Patient learns that lifestyle habits have a tremendous impact on disease risk and progression. This video provides eating and physical activity recommendations based on your personal health goals, such as reducing LDL cholesterol, losing weight, preventing or controlling type 2 diabetes, and reducing high blood pressure.  Vitamins and Minerals  Clinical staff conducted group or individual video education with verbal and written material and guidebook.  Patient learns different ways to obtain key vitamins and minerals, including through a recommended  healthy diet. It is important to discuss all supplements you take with your doctor.   Healthy Mind-Set    Smoking Cessation  Clinical staff conducted group or individual video education with verbal and written material and guidebook.  Patient learns that cigarette smoking and tobacco addiction pose a serious health risk which affects millions of people. Stopping smoking will significantly reduce the risk of heart disease, lung disease, and many forms of cancer. Recommended strategies for quitting are covered, including working with your doctor to develop a successful plan.  Culinary   Becoming a Set designer conducted group or individual video education with verbal and written material and guidebook.  Patient learns that cooking at home can be healthy, cost-effective, quick, and puts them in control. Keys to cooking healthy recipes will include looking at your recipe, assessing your equipment needs, planning ahead, making it simple, choosing cost-effective seasonal ingredients, and limiting the use of added fats, salts, and sugars.  Cooking - Breakfast and Snacks  Clinical staff conducted group or individual video education with verbal and written material and guidebook.  Patient learns how important breakfast is to satiety and nutrition through the entire day. Recommendations include key foods to eat during breakfast to help stabilize blood sugar levels and to prevent overeating at meals later in the day. Planning ahead is also a key component.  Cooking - Educational psychologist conducted group or individual video education with verbal and written material and guidebook.  Patient learns eating strategies to improve overall health, including an approach to cook more at home. Recommendations include thinking of animal protein as a side on your plate rather than center stage and focusing instead on lower calorie dense options like vegetables, fruits, whole grains, and  plant-based proteins, such as beans. Making sauces in large quantities to freeze for later and leaving the skin on your vegetables are also recommended to maximize your experience.  Cooking - Healthy Salads and Dressing Clinical staff conducted group or individual video education with verbal and written material and guidebook.  Patient learns that vegetables, fruits, whole grains, and legumes are the foundations of the Pritikin Eating Plan. Recommendations include how to  incorporate each of these in flavorful and healthy salads, and how to create homemade salad dressings. Proper handling of ingredients is also covered. Cooking - Soups and [employer] - Soups and Desserts Clinical staff conducted group or individual video education with verbal and written material and guidebook.  Patient learns that Pritikin soups and desserts make for easy, nutritious, and delicious snacks and meal components that are low in sodium, fat, sugar, and calorie density, while high in vitamins, minerals, and filling fiber. Recommendations include simple and healthy ideas for soups and desserts.   Overview     The Pritikin Solution Program Overview Clinical staff conducted group or individual video education with verbal and written material and guidebook.  Patient learns that the results of the Pritikin Program have been documented in more than 100 articles published in peer-reviewed journals, and the benefits include reducing risk factors for (and, in some cases, even reversing) high cholesterol, high blood pressure, type 2 diabetes, obesity, and more! An overview of the three key pillars of the Pritikin Program will be covered: eating well, doing regular exercise, and having a healthy mind-set.  WORKSHOPS  Exercise: Exercise Basics: Building Your Action Plan Clinical staff led group instruction and group discussion with PowerPoint presentation and patient guidebook. To enhance the learning environment the use of  posters, models and videos may be added. At the conclusion of this workshop, patients will comprehend the difference between physical activity and exercise, as well as the benefits of incorporating both, into their routine. Patients will understand the FITT (Frequency, Intensity, Time, and Type) principle and how to use it to build an exercise action plan. In addition, safety concerns and other considerations for exercise and cardiac rehab will be addressed by the presenter. The purpose of this lesson is to promote a comprehensive and effective weekly exercise routine in order to improve patients' overall level of fitness.   Managing Heart Disease: Your Path to a Healthier Heart Clinical staff led group instruction and group discussion with PowerPoint presentation and patient guidebook. To enhance the learning environment the use of posters, models and videos may be added.At the conclusion of this workshop, patients will understand the anatomy and physiology of the heart. Additionally, they will understand how Pritikin's three pillars impact the risk factors, the progression, and the management of heart disease.  The purpose of this lesson is to provide a high-level overview of the heart, heart disease, and how the Pritikin lifestyle positively impacts risk factors.  Exercise Biomechanics Clinical staff led group instruction and group discussion with PowerPoint presentation and patient guidebook. To enhance the learning environment the use of posters, models and videos may be added. Patients will learn how the structural parts of their bodies function and how these functions impact their daily activities, movement, and exercise. Patients will learn how to promote a neutral spine, learn how to manage pain, and identify ways to improve their physical movement in order to promote healthy living. The purpose of this lesson is to expose patients to common physical limitations that impact physical  activity. Participants will learn practical ways to adapt and manage aches and pains, and to minimize their effect on regular exercise. Patients will learn how to maintain good posture while sitting, walking, and lifting.  Balance Training and Fall Prevention  Clinical staff led group instruction and group discussion with PowerPoint presentation and patient guidebook. To enhance the learning environment the use of posters, models and videos may be added. At the conclusion of this workshop,  patients will understand the importance of their sensorimotor skills (vision, proprioception, and the vestibular system) in maintaining their ability to balance as they age. Patients will apply a variety of balancing exercises that are appropriate for their current level of function. Patients will understand the common causes for poor balance, possible solutions to these problems, and ways to modify their physical environment in order to minimize their fall risk. The purpose of this lesson is to teach patients about the importance of maintaining balance as they age and ways to minimize their risk of falling.  WORKSHOPS   Nutrition:  Fueling a Ship broker led group instruction and group discussion with PowerPoint presentation and patient guidebook. To enhance the learning environment the use of posters, models and videos may be added. Patients will review the foundational principles of the Pritikin Eating Plan and understand what constitutes a serving size in each of the food groups. Patients will also learn Pritikin-friendly foods that are better choices when away from home and review make-ahead meal and snack options. Calorie density will be reviewed and applied to three nutrition priorities: weight maintenance, weight loss, and weight gain. The purpose of this lesson is to reinforce (in a group setting) the key concepts around what patients are recommended to eat and how to apply these guidelines  when away from home by planning and selecting Pritikin-friendly options. Patients will understand how calorie density may be adjusted for different weight management goals.  Mindful Eating  Clinical staff led group instruction and group discussion with PowerPoint presentation and patient guidebook. To enhance the learning environment the use of posters, models and videos may be added. Patients will briefly review the concepts of the Pritikin Eating Plan and the importance of low-calorie dense foods. The concept of mindful eating will be introduced as well as the importance of paying attention to internal hunger signals. Triggers for non-hunger eating and techniques for dealing with triggers will be explored. The purpose of this lesson is to provide patients with the opportunity to review the basic principles of the Pritikin Eating Plan, discuss the value of eating mindfully and how to measure internal cues of hunger and fullness using the Hunger Scale. Patients will also discuss reasons for non-hunger eating and learn strategies to use for controlling emotional eating.  Targeting Your Nutrition Priorities Clinical staff led group instruction and group discussion with PowerPoint presentation and patient guidebook. To enhance the learning environment the use of posters, models and videos may be added. Patients will learn how to determine their genetic susceptibility to disease by reviewing their family history. Patients will gain insight into the importance of diet as part of an overall healthy lifestyle in mitigating the impact of genetics and other environmental insults. The purpose of this lesson is to provide patients with the opportunity to assess their personal nutrition priorities by looking at their family history, their own health history and current risk factors. Patients will also be able to discuss ways of prioritizing and modifying the Pritikin Eating Plan for their highest risk areas  Menu   Clinical staff led group instruction and group discussion with PowerPoint presentation and patient guidebook. To enhance the learning environment the use of posters, models and videos may be added. Using menus brought in from [employer], or printed from [employer], patients will apply the Pritikin dining out guidelines that were presented in the [employer] video. Patients will also be able to practice these guidelines in a variety of provided scenarios.  The purpose of this lesson is to provide patients with the opportunity to practice hands-on learning of the Pritikin Dining Out guidelines with actual menus and practice scenarios.  Label Reading Clinical staff led group instruction and group discussion with PowerPoint presentation and patient guidebook. To enhance the learning environment the use of posters, models and videos may be added. Patients will review and discuss the Pritikin label reading guidelines presented in Pritikin's Label Reading Educational series video. Using fool labels brought in from local grocery stores and markets, patients will apply the label reading guidelines and determine if the packaged food meet the Pritikin guidelines. The purpose of this lesson is to provide patients with the opportunity to review, discuss, and practice hands-on learning of the Pritikin Label Reading guidelines with actual packaged food labels. Cooking School  Pritikin's [employer] are designed to teach patients ways to prepare quick, simple, and affordable recipes at home. The importance of nutrition's role in chronic disease risk reduction is reflected in its emphasis in the overall Pritikin program. By learning how to prepare essential core Pritikin Eating Plan recipes, patients will increase control over what they eat; be able to customize the flavor of foods without the use of added salt, sugar, or fat; and improve the quality of the food they consume. By  learning a set of core recipes which are easily assembled, quickly prepared, and affordable, patients are more likely to prepare more healthy foods at home. These workshops focus on convenient breakfasts, simple entres, side dishes, and desserts which can be prepared with minimal effort and are consistent with nutrition recommendations for cardiovascular risk reduction. Cooking [employer] are taught by a Armed forces logistics/support/administrative officer (RD) who has been trained by the [employer]. The chef or RD has a clear understanding of the importance of minimizing - if not completely eliminating - added fat, sugar, and sodium in recipes. Throughout the series of Cooking School Workshop sessions, patients will learn about healthy ingredients and efficient methods of cooking to build confidence in their capability to prepare    Cooking School weekly topics:  Adding Flavor- Sodium-Free  Fast and Healthy Breakfasts  Powerhouse Plant-Based Proteins  Satisfying Salads and Dressings  Simple Sides and Sauces  International Cuisine-Spotlight on the [employer] Zones  Delicious Desserts  Savory Soups  [employer] - Meals in a Astronomer Appetizers and Snacks  Comforting Weekend Breakfasts  One-Pot Wonders   Fast Evening Meals  Landscape architect Your Pritikin Plate  WORKSHOPS   Healthy Mindset (Psychosocial):  Focused Goals, Sustainable Changes Clinical staff led group instruction and group discussion with PowerPoint presentation and patient guidebook. To enhance the learning environment the use of posters, models and videos may be added. Patients will be able to apply effective goal setting strategies to establish at least one personal goal, and then take consistent, meaningful action toward that goal. They will learn to identify common barriers to achieving personal goals and develop strategies to overcome them. Patients will also gain an understanding of how our mind-set can  impact our ability to achieve goals and the importance of cultivating a positive and growth-oriented mind-set. The purpose of this lesson is to provide patients with a deeper understanding of how to set and achieve personal goals, as well as the tools and strategies needed to overcome common obstacles which may arise along the way.  From Head to Heart: The Power of a Healthy Outlook  Clinical staff led group instruction and group  discussion with PowerPoint presentation and patient guidebook. To enhance the learning environment the use of posters, models and videos may be added. Patients will be able to recognize and describe the impact of emotions and mood on physical health. They will discover the importance of self-care and explore self-care practices which may work for them. Patients will also learn how to utilize the 4 C's to cultivate a healthier outlook and better manage stress and challenges. The purpose of this lesson is to demonstrate to patients how a healthy outlook is an essential part of maintaining good health, especially as they continue their cardiac rehab journey.  Healthy Sleep for a Healthy Heart Clinical staff led group instruction and group discussion with PowerPoint presentation and patient guidebook. To enhance the learning environment the use of posters, models and videos may be added. At the conclusion of this workshop, patients will be able to demonstrate knowledge of the importance of sleep to overall health, well-being, and quality of life. They will understand the symptoms of, and treatments for, common sleep disorders. Patients will also be able to identify daytime and nighttime behaviors which impact sleep, and they will be able to apply these tools to help manage sleep-related challenges. The purpose of this lesson is to provide patients with a general overview of sleep and outline the importance of quality sleep. Patients will learn about a few of the most common sleep  disorders. Patients will also be introduced to the concept of "sleep hygiene," and discover ways to self-manage certain sleeping problems through simple daily behavior changes. Finally, the workshop will motivate patients by clarifying the links between quality sleep and their goals of heart-healthy living.   Recognizing and Reducing Stress Clinical staff led group instruction and group discussion with PowerPoint presentation and patient guidebook. To enhance the learning environment the use of posters, models and videos may be added. At the conclusion of this workshop, patients will be able to understand the types of stress reactions, differentiate between acute and chronic stress, and recognize the impact that chronic stress has on their health. They will also be able to apply different coping mechanisms, such as reframing negative self-talk. Patients will have the opportunity to practice a variety of stress management techniques, such as deep abdominal breathing, progressive muscle relaxation, and/or guided imagery.  The purpose of this lesson is to educate patients on the role of stress in their lives and to provide healthy techniques for coping with it.  Learning Barriers/Preferences:  Learning Barriers/Preferences - 12/30/23 1543       Learning Barriers/Preferences   Learning Barriers Sight   wears glasses   Learning Preferences Skilled Demonstration;Video;Pictoral;Individual Instruction;Group Instruction;Computer/Internet          Education Topics:  Knowledge Questionnaire Score:  Knowledge Questionnaire Score - 12/30/23 1543       Knowledge Questionnaire Score   Pre Score 22/24          Core Components/Risk Factors/Patient Goals at Admission:  Personal Goals and Risk Factors at Admission - 12/30/23 1452       Core Components/Risk Factors/Patient Goals on Admission    Weight Management Weight Maintenance    Heart Failure Yes    Intervention Provide a combined exercise and  nutrition program that is supplemented with education, support and counseling about heart failure. Directed toward relieving symptoms such as shortness of breath, decreased exercise tolerance, and extremity edema.    Expected Outcomes Improve functional capacity of life;Short term: Attendance in program 2-3 days a week with increased  exercise capacity. Reported lower sodium intake. Reported increased fruit and vegetable intake. Reports medication compliance.;Short term: Daily weights obtained and reported for increase. Utilizing diuretic protocols set by physician.;Long term: Adoption of self-care skills and reduction of barriers for early signs and symptoms recognition and intervention leading to self-care maintenance.    Hypertension Yes    Intervention Provide education on lifestyle modifcations including regular physical activity/exercise, weight management, moderate sodium restriction and increased consumption of fresh fruit, vegetables, and low fat dairy, alcohol moderation, and smoking cessation.;Monitor prescription use compliance.    Expected Outcomes Short Term: Continued assessment and intervention until BP is < 140/80mm HG in hypertensive participants. < 130/84mm HG in hypertensive participants with diabetes, heart failure or chronic kidney disease.;Long Term: Maintenance of blood pressure at goal levels.    Lipids Yes    Intervention Provide education and support for participant on nutrition & aerobic/resistive exercise along with prescribed medications to achieve LDL 70mg , HDL >40mg .    Expected Outcomes Short Term: Participant states understanding of desired cholesterol values and is compliant with medications prescribed. Participant is following exercise prescription and nutrition guidelines.;Long Term: Cholesterol controlled with medications as prescribed, with individualized exercise RX and with personalized nutrition plan. Value goals: LDL < 70mg , HDL > 40 mg.          Core  Components/Risk Factors/Patient Goals Review:   Goals and Risk Factor Review     Row Name 01/08/24 0842 02/10/24 1435           Core Components/Risk Factors/Patient Goals Review   Personal Goals Review Weight Management/Obesity;Heart Failure;Lipids;Hypertension Weight Management/Obesity;Heart Failure;Lipids;Hypertension      Review Richard Mclaughlin started exercise at cardiac rehab on 01/05/24. Exercise is ccurrently on hold due to exertional angina has office follow up on 01/12/24 with Dr Ladona. Exercise remains on hold due to exertional angina has office follow up on 02/12/24 with Dr Ladona.      Expected Outcomes Richard Mclaughlin will resume cardiac rehab once cleared to return to exercise Richard Mclaughlin will resume cardiac rehab once cleared to return to exercise         Core Components/Risk Factors/Patient Goals at Discharge (Final Review):   Goals and Risk Factor Review - 02/10/24 1435       Core Components/Risk Factors/Patient Goals Review   Personal Goals Review Weight Management/Obesity;Heart Failure;Lipids;Hypertension    Review Exercise remains on hold due to exertional angina has office follow up on 02/12/24 with Dr Ladona.    Expected Outcomes Richard Mclaughlin will resume cardiac rehab once cleared to return to exercise          ITP Comments:  ITP Comments     Row Name 12/30/23 1415 01/08/24 0840 02/10/24 1430       ITP Comments Wilbert Bihari, MD: Medical Director. Introduction to the [facility]/Intensive Cardiac Rehab. Initial orientation packet reviewed with the patient. 30 Day ITP Review. Exercise is currently on hold due to exertional angina 30 Day ITP Review. Exercise remains on hold. Richard Mclaughlin has an appointment to see Dr Ladona on 02/12/24        Comments: See ITP comments.Hadassah Elpidio Quan RN BSN

## 2024-02-11 ENCOUNTER — Encounter (HOSPITAL_COMMUNITY)

## 2024-02-12 ENCOUNTER — Encounter: Payer: Self-pay | Admitting: Cardiology

## 2024-02-12 ENCOUNTER — Ambulatory Visit: Attending: Cardiology | Admitting: Cardiology

## 2024-02-12 ENCOUNTER — Other Ambulatory Visit (HOSPITAL_COMMUNITY): Payer: Self-pay

## 2024-02-12 ENCOUNTER — Telehealth (HOSPITAL_COMMUNITY): Payer: Self-pay | Admitting: *Deleted

## 2024-02-12 VITALS — BP 110/68 | HR 57 | Resp 16 | Ht 73.0 in | Wt 190.8 lb

## 2024-02-12 DIAGNOSIS — I25118 Atherosclerotic heart disease of native coronary artery with other forms of angina pectoris: Secondary | ICD-10-CM

## 2024-02-12 DIAGNOSIS — I255 Ischemic cardiomyopathy: Secondary | ICD-10-CM | POA: Diagnosis not present

## 2024-02-12 DIAGNOSIS — I5022 Chronic systolic (congestive) heart failure: Secondary | ICD-10-CM

## 2024-02-12 LAB — HEMOGLOBIN AND HEMATOCRIT, BLOOD
Hematocrit: 43.5 % (ref 37.5–51.0)
Hemoglobin: 14.2 g/dL (ref 13.0–17.7)

## 2024-02-12 MED ORDER — NITROGLYCERIN 0.4 MG SL SUBL
0.4000 mg | SUBLINGUAL_TABLET | SUBLINGUAL | 2 refills | Status: AC | PRN
  Filled 2024-02-12: qty 25, 8d supply, fill #0

## 2024-02-12 NOTE — Telephone Encounter (Signed)
 Spoke with [name]. Will cancel cardiac rehab appointments at this time due to ongoing cardiac testing. Will be happy to bring Richard Mclaughlin's when cleared by Dr Ladona. Patient is agreeable to the plan.Hadassah Elpidio Quan RN BSN

## 2024-02-12 NOTE — Patient Instructions (Signed)
 Medication Instructions:  Refilled sublingual Nitroglycerin   *If you need a refill on your cardiac medications before your next appointment, please call your pharmacy*  Lab Work: NONE If you have labs (blood work) drawn today and your tests are completely normal, you will receive your results only by: MyChart Message (if you have MyChart) OR A paper copy in the mail If you have any lab test that is abnormal or we need to change your treatment, we will call you to review the results.  Testing/Procedures: NONE  Follow-Up: At [facility], you and your health needs are our priority.  As part of our continuing mission to provide you with exceptional heart care, our providers are all part of one team.  This team includes your primary Cardiologist (physician) and Advanced Practice Providers or APPs (Physician Assistants and Nurse Practitioners) who all work together to provide you with the care you need, when you need it.  Your next appointment:   Monday, November 24th, 2025 at 10:20 AM  Provider:   Gordy Bergamo, MD    We recommend signing up for the patient portal called MyChart.  Sign up information is provided on this After Visit Summary.  MyChart is used to connect with patients for Virtual Visits (Telemedicine).  Patients are able to view lab/test results, encounter notes, upcoming appointments, etc.  Non-urgent messages can be sent to your provider as well.   To learn more about what you can do with MyChart, go to [email or web address].

## 2024-02-12 NOTE — Progress Notes (Signed)
 Cardiology Office Note:  .   Date:  02/12/2024  ID:  Richard Mclaughlin, DOB 06-Jun-1940, MRN [id number] PCP: [name], PA-C  [facility] Providers Cardiologist:  Gordy Bergamo, MD   History of Present Illness: .   Richard Mclaughlin is a 83 y.o.  Caucasian male patient with coronary disease, hyperglycemia/prediabetes mellitus with stage III chronic kidney disease, hypertension, hypercholesterolemia, OSA on CPAP.  Presented with late inferior and posterior STEMI and cardiac catheterization on 09/19/2023 revealing CTO RCA and CX with collaterals from LAD and proximal LAD had a 80% and D1 80 to 90% stenosis and recommended medical therapy in view of late presentation and EF 25 to 30% with consideration for revascularization of the LAD once stabilized from myocardial infarction.    Presently on Lipitor  80 mg plus Zetia  10 mg daily, carvedilol  6.25 mg twice daily and Entresto  24/26 mg twice daily, could not uptitrate the medication due to soft blood pressure.  He continues to have exertional chest pain and in fact cardiac rehab has been on hold due to chest pain.  He underwent repeat echocardiogram on 01/27/2023 revealing slight improvement in EF to 30 to 35% from 25% with anterior, anterolateral, septal and apical akinesis.  He has now been scheduled for cardiac MR to assess viability.  He is accompanied by his wife, recently made a trip to [place]  for his granddaughters wedding and had 1 episode of angina for which he took nitroglycerin .  Otherwise is doing well and has been walking for about a mile a day with mild episodes of chest discomfort occasionally not any nitroglycerin  use.  Cardiac Studies relevent.    Echocardiogram 01/27/2024:  1. Limited echocardiogram - for details reference the full study 12/09/2023. 2. The inferior wall and posterior wall are akinetic. The anterior wall, antero-lateral wall, mid inferoseptal segment, apical inferior segment, and basal inferoseptal  segment are hypokinetic. 3. Left ventricular ejection fraction, by estimation, is 30 to 35%. The left ventricle has moderately decreased function.    4. 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    5. Compared to 09/19/2023, EF is improved from 25%.    Cardiac Catheterization 09/19/23: Angiographic data: LV: Entire inferior and inferoapical akinesis.  LVEF 25 to 30%.  No significant mitral regurgitation. RCA: Occluded in the proximal segment.  Faint collaterals to the PDA from LAD. LM: Large-caliber vessel, normal. LAD: Diffusely diseased and mildly calcified throughout.  Proximal segment has a 80% stenosis.  Moderate-sized D-1 with ostial 80% to 90% stenosis small to moderate-sized D2 with mild 20% ostial stenosis. LCx: Occluded in the proximal segment.  OM1 which appears to be moderate to large size has faint ipsilateral collaterals.  Distal CX not seen.       Discussed the use of AI scribe software for clinical note transcription with the patient, who gave verbal consent to proceed.  History of Present Illness He was referred by the cardiac rehab team due to chest pain during exercise.  He experiences chest pain during exercise, which has prevented him from attending cardiac rehab sessions. An episode of indigestion and heartburn after consuming salty food felt similar to his original chest pain, and a nitroglycerin  tablet alleviated the discomfort.  He is currently taking Coreg  6.25 mg twice daily, Entresto  24/26 mg, atorvastatin  80 mg, Zetia  10 mg, and Imdur  30 mg daily. He carries nitroglycerin  tablets for angina. He follows a low-salt diet, which he finds challenging but necessary for managing his condition.  He does not drive  and relies on his wife for transportation. No dizziness or swelling in his legs, and he sleeps well.  Labs   Lab Results  Component Value Date   CHOL 122 01/13/2024   HDL 35 (L) 01/13/2024   LDLCALC 59 01/13/2024   TRIG 166 (H) 01/13/2024   CHOLHDL 3.5  01/13/2024   Lipoprotein (a)  Date/Time Value Ref Range Status  09/19/2023 06:33 AM 143.2 (H) <75.0 nmol/L Final    Comment:    (NOTE) Note:  Values greater than or equal to 75.0 nmol/L may       indicate an independent risk factor for CHD,       but must be evaluated with caution when applied       to non-Caucasian populations due to the       influence of genetic factors on Lp(a) across       ethnicities. Performed At: [facility] [address] [place], [place] [id number] Jennette Shorter MD Ey:[id number]     Recent Labs    09/19/23 [id number] 09/20/23 0323 09/21/23 0348 10/09/23 1530 01/13/24 0837  NA 134* 134* 134* 140 143  K 3.7 4.1 3.5 4.7 5.0  CL 102 101 100 103 105  CO2 22 20* 21* 22 21  GLUCOSE 152* 127* 115* 128* 119*  BUN 17 26* 30* 20 18  CREATININE 1.18 1.51* 1.49* 1.30* 1.32*  CALCIUM  9.6 9.3 9.1 9.9 9.6  GFRNONAA >60 46* 47*  --   --     Lab Results  Component Value Date   ALT 18 01/13/2024   AST 22 01/13/2024   ALKPHOS 74 01/13/2024   BILITOT 0.4 01/13/2024      Latest Ref Rng & Units 01/13/2024    8:37 AM 09/20/2023    3:23 AM 09/19/2023    2:53 AM  CBC  WBC 3.4 - 10.8 x10E3/uL 5.4  8.9  10.6   Hemoglobin 13.0 - 17.7 g/dL 85.3  86.0  85.5   Hematocrit 37.5 - 51.0 % 43.9  40.1  41.0   Platelets 150 - 450 x10E3/uL 170  169  187    Lab Results  Component Value Date   HGBA1C 6.4 (H) 09/19/2023    No results found for: TSH   ROS  Review of Systems  Cardiovascular:  Positive for chest pain. Negative for dyspnea on exertion and leg swelling.   Physical Exam:   VS:  BP 110/68 (BP Location: Left Arm, Patient Position: Sitting, Cuff Size: Normal)   Pulse (!) 57   Resp 16   Ht 6' 1 (1.854 m)   Wt 190 lb 12.8 oz (86.5 kg)   SpO2 96%   BMI 25.17 kg/m    Wt Readings from Last 3 Encounters:  02/12/24 190 lb 12.8 oz (86.5 kg)  01/12/24 191 lb (86.6 kg)  12/30/23 190 lb 7.6 oz (86.4 kg)    BP Readings from Last 3 Encounters:  02/12/24  110/68  01/12/24 124/66  12/30/23 (!) 96/58   Physical Exam Neck:     Vascular: No JVD.  Cardiovascular:     Rate and Rhythm: Normal rate and regular rhythm.     Pulses: Normal pulses and intact distal pulses.     Heart sounds: S1 normal and S2 normal. Murmur heard.     Midsystolic murmur is present with a grade of 2/6 at the apex.     No gallop.  Pulmonary:     Effort: Pulmonary effort is normal.     Breath sounds: Normal  breath sounds.  Abdominal:     General: Bowel sounds are normal.     Palpations: Abdomen is soft.  Musculoskeletal:     Right lower leg: No edema.     Left lower leg: No edema.    EKG:         ASSESSMENT AND PLAN: .      ICD-10-CM   1. Coronary artery disease of native artery of native heart with stable angina pectoris (HCC)  I25.118 nitroGLYCERIN  (NITROSTAT ) 0.4 MG SL tablet    2. Ischemic cardiomyopathy  I25.5     3. Chronic systolic CHF (congestive heart failure) (HCC)  I50.22      Assessment & Plan Ischemic cardiomyopathy with chronic systolic heart failure and atherosclerotic heart disease with angina Chronic systolic heart failure with ischemic cardiomyopathy and atherosclerotic heart disease. Intermittent angina, improved with nitroglycerin . Recent episode of indigestion resembling angina, resolved with medication. Currently on Coreg  6.25 mg twice daily and Entresto  24/26 mg. - Perform cardiac MRI with contrast to assess for potential viability especially in the LAD distribution and consider bypass surgery or stent placement depending upon HEART TEAM discussion. - Discuss case in heart team meeting. - Consider increasing Entresto  to two tablets in the morning and two at night post-MRI to improve heart function and longevity, monitor for dizziness due to potential decreased blood pressure.  Will need follow-up BMP if dose is increased and patient will contact us  if he tolerates higher dose without side effects of dizziness as his blood pressure is  soft.  He will also need a new prescription refill as well.  Mild improvement in LVEF from <25% to the present 30 to 35% per recent echocardiogram. - Ensure nitroglycerin  prescription is filled with 25 tablets and two refills. - Advise carrying nitroglycerin  in a cool place, replace monthly. - Allow dental cleaning without antibiotics. - Reassess cardiac rehab participation post-treatment decision.  Hyperlipidemia Hyperlipidemia with slightly elevated triglycerides and LDL. Lipids are well controlled with atorvastatin  80 mg and Zetia  10 mg daily.  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 - Continue atorvastatin  80 mg daily. - Continue Zetia  10 mg daily. - Maintain low-salt and low-fat diet to aid in lipid control.   Follow up: 2 months however if addition is made for either CABG or PCI, I will contact the patient and arrange this in the outpatient basis.  Signed,  Gordy Bergamo, MD, [facility] 02/12/2024, 1:45 PM [facility] [address] [place], [place] [address] Phone: [phone]. Fax:  [phone]

## 2024-02-13 ENCOUNTER — Ambulatory Visit: Payer: Self-pay | Admitting: Cardiology

## 2024-02-13 ENCOUNTER — Ambulatory Visit: Payer: Self-pay | Admitting: Emergency Medicine

## 2024-02-13 ENCOUNTER — Encounter (HOSPITAL_COMMUNITY): Admission: RE | Admit: 2024-02-13 | Source: Ambulatory Visit

## 2024-02-13 NOTE — Progress Notes (Signed)
 I think he needs BMP for his MRI. We could ask if [facility] still has blood sample and add S. Cr or the whole BMP to this. If not we could probably do a iSTAT during his visit to radiology

## 2024-02-16 ENCOUNTER — Telehealth: Payer: Self-pay | Admitting: Cardiology

## 2024-02-16 ENCOUNTER — Encounter (HOSPITAL_COMMUNITY)

## 2024-02-16 NOTE — Telephone Encounter (Signed)
    Pre-operative Risk Assessment    Patient Name: Richard Mclaughlin  DOB: 11/24/1940 MRN: [id number]      Request for Surgical Clearance  Dental Cleaning  Procedure:  Dental cleaning  Date of Surgery:  Clearance 02/19/24                                 Surgeon:  [facility] or Practice Name:   Phone number:  [phone] Fax number:  [phone]   Type of Clearance Requested:   - Medical    Type of Anesthesia:  None    Additional requests/questions:    Bonney Frederik Suzen GORMAN   02/16/2024, 11:47 AM

## 2024-02-16 NOTE — Telephone Encounter (Signed)
   Patient Name: JERRET MCBANE  DOB: Jun 30, 1940 MRN: [id number]  Primary Cardiologist: Gordy Bergamo, MD  Chart reviewed as part of pre-operative protocol coverage.   Dental extractions (i.e. 1-2 teeth) are considered low risk procedures per guidelines and generally do not require any specific cardiac clearance. It is also generally accepted that for simple extractions and dental cleanings, there is no need to interrupt blood thinner therapy.   SBE prophylaxis is not required for the patient from a cardiac standpoint.  I will route this recommendation to the requesting party via Epic fax function and remove from pre-op pool.  Please call with questions.  Jon Garre Janace Decker, PA 02/16/2024, 3:25 PM

## 2024-02-17 NOTE — Progress Notes (Deleted)
 Cardiology Clinic Note   Patient Name: Richard Mclaughlin Date of Encounter: 02/17/2024  Primary Care Provider:  Podraza, Cole Christopher, PA-C Primary Cardiologist:  Gordy Bergamo, MD  Patient Profile    Richard Mclaughlin 83 year old male presents to the clinic today for follow-up evaluation of his coronary artery disease and ischemic cardiomyopathy.  Past Medical History    Past Medical History:  Diagnosis Date   CKD stage 3a, GFR 45-59 ml/min (HCC) 09/19/2023   High cholesterol    Hypertension 09/19/2023   Non-insulin  dependent type 2 diabetes mellitus (HCC) 09/19/2023   OSA (obstructive sleep apnea) 09/19/2023   Past Surgical History:  Procedure Laterality Date   LEFT HEART CATH AND CORONARY ANGIOGRAPHY N/A 09/19/2023   Procedure: LEFT HEART CATH AND CORONARY ANGIOGRAPHY;  Surgeon: Bergamo Gordy, MD;  Location: [facility];  Service: Cardiovascular;  Laterality: N/A;    Allergies  Allergies  Allergen Reactions   Azithromycin Other (See Comments)    Hemolytic anemia   Simvastatin     Other reaction(s): Muscle Pain    History of Present Illness    Richard Mclaughlin has a PMH of ischemic cardiomyopathy LVEF 25%, AKI, type 2 diabetes, OSA, hyperlipidemia, and coronary artery disease.  He was a late presentation inferior and posterior STEMI.  He was noted to have multivessel coronary artery disease.  He presented to the hospital on 09/19/2023 and was discharged on 09/21/2023.  He noted epigastric pain, nausea, and his ingestion type symptoms.  His EKG showed ST elevations in inferior leads, lateral leads and ST depression in anterior leads.  His cardiac troponins were elevated at 15,000-20,000 and decreased to 15,283.  He underwent left heart catheterization which showed RCA CTO with collaterals to PDA from LAD, proximal LAD 80%, D1 80-90% stenosis, ostial circumflex chronic total occlusion.  His LV gram showed inferior and inferior apical akinesis.  His LVEF was  noted to be 25-30%.  No PCI was performed due to absence of symptoms and late presentation of STEMI.  Medical management was recommended.  He was given a LifeVest at discharge.  He presented to the clinic 10/09/23 for follow-up evaluation and stated he had been slowly increasing his physical activity.  He had been walking in his neighborhood.  He reported that he walked both down and up inclines.  He had been doing well with this.  He did note increased work of breathing with increased physical activity.  His breathing recovered quickly.  His blood pressure today was 100/60.  He was euvolemic.  He was tolerating his medications well.  We reviewed the importance of low-sodium diet.   l ordered a BMP.    I was unable to uptitrate GDMT due to blood pressure.  Follow-up in 1-2 months was planned.  His BMP showed stable electrolytes and improved renal function.  He presented to the clinic 12/01/23 for follow-up evaluation and stated he had been having trouble with the heat.  We reviewed his previous clinic visit and his medications.  We reviewed his decreased EF.  He and his wife expressed understanding.  We reviewed recommendations for not exercising and temperatures over 80 or less than 32.  He had recent lab work drawn with his PCP.  We reviewed this.  His LDL cholesterol decreased to 85.  I added ezetimibe .  His blood pressure was 114/60.  His pulses 62. I  planned for repeat echocardiogram in 1 month and plan follow-up in 3 to 4 months.  He was  seen in follow-up by Dr. Ladona on 01/12/2024 and again on 02/12/2024.  He was continued on carvedilol , Entresto , atorvastatin , and ezetimibe .  Continued to be unable to uptitrate medication due to blood pressure.  He reported exertional chest pain.  Cardiac rehab was on hold.  His repeat echocardiogram 8/24 was discussed.  Cardiac MRI was scheduled.  He presented with his wife.  They had taken a trip to [place]  for his granddaughters graduation.  He had 1 episode of angina  and took nitroglycerin .  He was doing well and walking about a mile a day.  He noted mild episodes of chest discomfort occasionally.  He was not taking nitroglycerin  with this.  Repeat echocardiogram 01/27/2024 showed LVEF of 30-35%.  It was recommended that he have cardiac MRI to assess for viability.  Cardiac MRI now scheduled.   He presents to the clinic today for follow-up evaluation and states***.   Today he denies chest pain, shortness of breath, lower extremity edema, fatigue, palpitations, melena, hematuria, hemoptysis, diaphoresis, weakness, presyncope, syncope, orthopnea, and PND.   Home Medications    Prior to Admission medications   Medication Sig Start Date End Date Taking? Authorizing Provider  acetaminophen  (TYLENOL ) 500 MG tablet Take 500-1,000 mg by mouth every 6 (six) hours as needed for moderate pain (pain score 4-6).    [provider]  aspirin  EC 81 MG tablet Take 81 mg by mouth daily.    [provider]  atorvastatin  (LIPITOR ) 80 MG tablet Take 1 tablet (80 mg total) by mouth daily. 09/22/23   Danford, Lonni SQUIBB, MD  carvedilol  (COREG ) 3.125 MG tablet Take 1 tablet (3.125 mg total) by mouth 2 (two) times daily with a meal. 09/21/23   Danford, Lonni SQUIBB, MD  cetirizine (ZYRTEC) 10 MG tablet Take 10 mg by mouth daily.    [provider]  clopidogrel  (PLAVIX ) 75 MG tablet Take 1 tablet (75 mg total) by mouth daily. 09/22/23   Danford, Lonni SQUIBB, MD  cyanocobalamin (,VITAMIN B-12,) 1000 MCG/ML injection Inject 1,000 mcg into the muscle every 30 (thirty) days.  Patient not taking: Reported on 09/19/2023    [provider]  Cyanocobalamin (B-12 PO) Take 1 tablet by mouth daily.    [provider]  Multiple Vitamin (MULTIVITAMIN WITH MINERALS) TABS tablet Take 1 tablet by mouth daily.    [provider]  Multiple Vitamins-Minerals (ZINC PO) Take 1 tablet by mouth daily as needed (when starting to feel sick.).     [provider]  nitroGLYCERIN  (NITROSTAT ) 0.4 MG SL tablet Place 1 tablet (0.4 mg total) under the tongue every 5 (five) minutes x 3 doses as needed for chest pain. 09/21/23   Danford, Lonni SQUIBB, MD  sacubitril -valsartan  (ENTRESTO ) 24-26 MG Take 1 tablet by mouth 2 (two) times daily. 09/21/23   Danford, Lonni SQUIBB, MD  VITAMIN D PO Take 1 tablet by mouth daily.    [provider]    Family History    No family history on file. has no family status information on file.   Social History    Social History   Socioeconomic History   Marital status: Married    Spouse name: Not on file   Number of children: Not on file   Years of education: Not on file   Highest education level: Not on file  Occupational History   Not on file  Tobacco Use   Smoking status: Former    Current packs/day: 0.00    Types: Cigarettes  Quit date: 06/04/1983    Years since quitting: 40.7   Smokeless tobacco: Not on file  Vaping Use   Vaping status: Never Used  Substance and Sexual Activity   Alcohol use: Yes   Drug use: No   Sexual activity: Yes  Other Topics Concern   Not on file  Social History Narrative   Not on file   Social Drivers of Health   Financial Resource Strain: Not on file  Food Insecurity: Low Risk  (11/18/2023)   Received from [facility]   Hunger Vital Sign    Within the past 12 months, you worried that your food would run out before you got money to buy more: Never true    Within the past 12 months, the food you bought just didn't last and you didn't have money to get more. : Never true  Transportation Needs: No Transportation Needs (11/18/2023)   Received from [facility]    In the past 12 months, has lack of reliable transportation kept you from medical appointments, meetings, work or from getting things needed for daily living? : No  Physical Activity: Not on file  Stress: Not on file  Social Connections: Moderately Integrated  (09/19/2023)   Social Connection and Isolation Panel    Frequency of Communication with Friends and Family: Once a week    Frequency of Social Gatherings with Friends and Family: More than three times a week    Attends Religious Services: 1 to 4 times per year    Active Member of [employer] or Organizations: No    Attends Banker Meetings: Never    Marital Status: Married  Catering manager Violence: Not At Risk (09/19/2023)   Humiliation, Afraid, Rape, and Kick questionnaire    Fear of Current or Ex-Partner: No    Emotionally Abused: No    Physically Abused: No    Sexually Abused: No     Review of Systems    General:  No chills, fever, night sweats or weight changes.  Cardiovascular:  No chest pain, dyspnea on exertion, edema, orthopnea, palpitations, paroxysmal nocturnal dyspnea. Dermatological: No rash, lesions/masses Respiratory: No cough, dyspnea Urologic: No hematuria, dysuria Abdominal:   No nausea, vomiting, diarrhea, bright red blood per rectum, melena, or hematemesis Neurologic:  No visual changes, wkns, changes in mental status. All other systems reviewed and are otherwise negative except as noted above.  Physical Exam    VS:  There were no vitals taken for this visit. , BMI There is no height or weight on file to calculate BMI. GEN: Well nourished, well developed, in no acute distress. HEENT: normal. Neck: Supple, no JVD, carotid bruits, or masses. Cardiac: RRR, no murmurs, rubs, or gallops. No clubbing, cyanosis, edema.  Radials/DP/PT 2+ and equal bilaterally.  Respiratory:  Respirations regular and unlabored, clear to auscultation bilaterally. GI: Soft, nontender, nondistended, BS + x 4. MS: no deformity or atrophy. Skin: warm and dry, no rash. Neuro:  Strength and sensation are intact. Psych: Normal affect.  Accessory Clinical Findings    Recent Labs: 09/19/2023: Magnesium 2.0 01/13/2024: ALT 18; BUN 18; Creatinine, Ser 1.32; Platelets 170; Potassium  5.0; Sodium 143 02/12/2024: Hemoglobin 14.2   Recent Lipid Panel    Component Value Date/Time   CHOL 122 01/13/2024 0837   TRIG 166 (H) 01/13/2024 0837   HDL 35 (L) 01/13/2024 0837   CHOLHDL 3.5 01/13/2024 0837   LDLCALC 59 01/13/2024 0837    No BP recorded.  {Refresh Note OR  Click here to enter BP  :1}***    ECG personally reviewed by me today-     Echocardiogram 09/19/2023 IMPRESSIONS     1. Poor acoustic windows Definity  used. There is severe  hypokinesis/akinesis of the lateral wall, anterior wall, inferior wall.  Left ventricular ejection fraction, by estimation, is 25%. The left  ventricle has severely decreased function. There is mild  left ventricular hypertrophy.   2. Right ventricular systolic function is normal. The right ventricular  size is normal.   3. The mitral valve is normal in structure. Trivial mitral valve  regurgitation.   4. The aortic valve is tricuspid. Aortic valve regurgitation is not  visualized. Aortic valve sclerosis/calcification is present, without any  evidence of aortic stenosis.   5. The inferior vena cava is normal in size with greater than 50%  respiratory variability, suggesting right atrial pressure of 3 mmHg.   FINDINGS   Left Ventricle: Poor acoustic windows Definity  used. There is severe  hypokinesis/akinesis of the lateral wall, anterior wall, inferior wall.  Left ventricular ejection fraction, by estimation, is 25%. The left  ventricle has severely decreased function.  Definity  contrast agent was given IV to delineate the left ventricular  endocardial borders. The left ventricular internal cavity size was normal  in size. There is mild left ventricular hypertrophy.   Right Ventricle: The right ventricular size is normal. Right vetricular  wall thickness was not assessed. Right ventricular systolic function is  normal.   Left Atrium: Left atrial size was normal in size.   Right [facility]: Right atrial size was normal in size.    Pericardium: There is no evidence of pericardial effusion.   Mitral Valve: The mitral valve is normal in structure. Trivial mitral  valve regurgitation.   Tricuspid Valve: The tricuspid valve is normal in structure. Tricuspid  valve regurgitation is trivial.   Aortic Valve: The aortic valve is tricuspid. Aortic valve regurgitation is  not visualized. Aortic valve sclerosis/calcification is present, without  any evidence of aortic stenosis.   Pulmonic Valve: The pulmonic valve was normal in structure. Pulmonic valve  regurgitation is trivial.   Aorta: The aortic root and ascending aorta are structurally normal, with  no evidence of dilitation.   Venous: The inferior vena cava is normal in size with greater than 50%  respiratory variability, suggesting right atrial pressure of 3 mmHg.   IAS/Shunts: No atrial level shunt detected by color flow Doppler.   Echocardiogram 01/27/2024  IMPRESSIONS     1. Limited echocardiogram - for details reference the full study  12/09/2023.   2. The inferior wall and posterior wall are akinetic. The anterior wall,  antero-lateral wall, mid inferoseptal segment, apical inferior segment,  and basal inferoseptal segment are hypokinetic.   3. Left ventricular ejection fraction, by estimation, is 30 to 35%. The  left ventricle has moderately decreased function.   4. The mitral valve is grossly normal.   5. The aortic valve is grossly normal.   Conclusion(s)/Recommendation(s): Consider cMRI to further evaluate the  LVEF, RWMAs, and viable myocardium.   FINDINGS   Left Ventricle: Left ventricular ejection fraction, by estimation, is 30  to 35%. The left ventricle has moderately decreased function. Definity   contrast agent was given IV to delineate the left ventricular endocardial  borders.     LV Wall Scoring:  The inferior wall and posterior wall are akinetic. The anterior wall,  antero-lateral wall, mid inferoseptal segment, apical  inferior segment,  and  basal inferoseptal segment are  hypokinetic.   Mitral Valve: The mitral valve is grossly normal.   Tricuspid Valve: The tricuspid valve is grossly normal.   Aortic Valve: The aortic valve is grossly normal.   Sunit Tolia  Electronically signed by Madonna Large  Signature Date/Time: 01/27/2024/11:02:27 PM        Final      Cardiac catheterization 09/19/2023    1st Diag lesion is 80% stenosed.   2nd Diag lesion is 20% stenosed.   Cardiac Catheterization 09/19/23: Hemodynamic data: LVEDP 18 mmHg.  Mild to moderately elevated.  No pressure gradient across the aortic valve.   Angiographic data: LV: Entire inferior and inferoapical akinesis.  LVEF 25 to 30%.  No significant mitral regurgitation. RCA: Occluded in the proximal segment.  Faint collaterals to the PDA from LAD. LM: Large-caliber vessel, normal. LAD: Diffusely diseased and mildly calcified throughout.  Proximal segment has a 80% stenosis.  Moderate-sized D-1 with ostial 80% to 90% stenosis small to moderate-sized D2 with mild 20% ostial stenosis. LCx: Occluded in the proximal segment.  OM1 which appears to be moderate to large size has faint ipsilateral collaterals.  Distal CX not seen.      Impression and recommendations: Patient presenting late with inferior and posterior STEMI, essentially living off of LAD now.  LVEF appears to be severely reduced.  Will medically optimize him, started him on Entresto , Jardiance , Coreg  and discontinue metoprolol  tartrate.  Once optimized, if he has any recurrence of angina we will consider PCI to the LAD.      Assessment & Plan   1.  Ischemic cardiomyopathy-weight today 19***2 lbs.    LVEF previously noted to be 25-30%.  Repeat echocardiogram showed EF of 30-35% 8/25.  BP today 10***0/60.  Unable to obtain trach GDMT due to blood pressure and renal function.    BP today 114/***60 Continue carvedilol , Entresto , Jardiance  Heart healthy low-sodium  diet Continue to progress with physical activity Plan for repeat echocardiogram at the end of July  Coronary artery disease-cardiac rehab continues to be on hold.  Continues to be somewhat physically active at home.  He was a late presentation STEMI 09/19/2023.  He was noted to have multivessel coronary artery disease.  He has collaterals from chronic RCA to PDA from LAD, proximal LAD was 80% stenosed, D1 80-90% stenosed, ostial circumflex chronic total occlusion with collaterals from LAD.  Awaiting cardiac MRI to see if he may be candidate for CABG.  MRI planned for 03/01/2024. Continue aspirin , Plavix  Heart healthy low-sodium high-fiber diet Increase physical activity as tolerated Order BMP  Hyperlipidemia-LDL 116 on 2/25.   85 on 11/18/23. Goal LDL less than 55.  01/13/2024: Cholesterol, Total 122; HDL 35; LDL Chol Calc (NIH) 59; Triglycerides 166 High-fiber diet Continue atorvastatin , zetia   OSA-waking up rested.  Continue CPAP use Weight loss Avoid supine sleeping Sleep hygiene instructions  AKI-creatinine 1.32 on 01/13/2024.   Follows with PCP  Type 2 diabetes-A1c 6.62/25.  Glucose 119 on 01/13/2024  Continue carb modified diet-reviewed Following with PCP  Disposition: Follow-up with Dr. Ladona as scheduled   Richard HERO. Babetta Paterson NP-C     02/17/2024, 6:07 AM [facility] [address] Office [phone] Fax [phone]    I spent 14***minutes examining this patient, reviewing medications, and using patient centered shared decision making involving their cardiac care.   I spent  20 minutes reviewing past medical history,  medications, and prior cardiac tests.

## 2024-02-18 ENCOUNTER — Encounter (HOSPITAL_COMMUNITY)

## 2024-02-18 ENCOUNTER — Ambulatory Visit: Admitting: General Practice

## 2024-02-19 NOTE — Heart Team MDD (Signed)
   Heart Team Multi-Disciplinary Discussion  Patient: Richard Mclaughlin  DOB: 06-02-1941  MRN: [id number]   Date: 02/19/2024  12:28 PM    Attendees: Interventional Cardiology: Alm Clay, MD Gordy Bergamo, MD Newman Lawrence, MD Deatrice Cage, MD Lurena Red, MD Lonni Hanson, MD Debby Como, MD Ozell Fell, MD Lonni Cash, MD  Cardiothoracic Surgery: Linnie Rayas, MD   Additional Attendees: Con Clunes, MD   Patient History: 83 y.o. caucasian male patient with exertional chest pain and in fact cardiac rehab has been on hold due to repeat chest pain.  He underwent repeat echocardiogram on 01/27/2023 revealing slight improvement in EF to 30 to 35% from 25% with anterior, anterolateral, septal and apical akinesis.  He has now been scheduled for cardiac MR to assess viability.  Otherwise is doing well and has been walking for about a mile a day with mild episodes of chest discomfort occasionally without any nitroglycerin  use.    Risk Factors: Hypertension Hyperlipidemia Diabetes Mellitus Chronic Kidney Disease Additional Risk Factors: OSA on CPAP   CAD History: Presented with late inferior and posterior STEMI and cardiac catheterization on 09/19/2023 revealing CTO RCA and CX with collaterals from LAD and proximal LAD had a 80% and D1 80 to 90% stenosis. At that time, recommended medical therapy in view of late presentation and EF 25 to 30% with consideration for revascularization of the LAD once stabilized from MI.     Review of Prior Angiography and PCI Procedures: The echocardiogram from 01/27/2024 was reviewed and discussed in detail including: The inferior wall and posterior wall are akinetic. The anterior wall, antero-lateral wall, mid inferoseptal segment, apical inferior segment, and basal inferoseptal segment are hypokinetic. Left ventricular ejection fraction, by estimation, is 30 to 35% which is actually improved from the echo on  09/19/2023 where the EF was only 25%. The left ventricle has moderately decreased function. 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 Additionally, the cardiac cath from 09/19/2023 images were reviewed and discussed in detail including: LV: Entire inferior and inferoapical akinesis.  LVEF 25 to 30%.  No significant mitral regurgitation. RCA: Occluded in the proximal segment. Faint collaterals to the PDA from LAD. LM: Large-caliber vessel, normal. LAD: Diffusely diseased and mildly calcified throughout.  Proximal segment has a 80% stenosis. Moderate-sized D-1 with ostial 80% to 90% stenosis small to moderate-sized D2 with mild 20% ostial stenosis. LCx: Occluded in the proximal segment. OM1 which appears to be moderate to large size has faint ipsilateral collaterals.    Discussion: After presentation, consideration of treatment options occurred contrasting referral to CVTS for possible CABG with continuation of medical therapy. Discussion surrounded that the patient has soft blood pressure and is already on Lipitor  80 mg plus Zetia  10 mg daily, carvedilol  6.25 mg twice daily and Entresto  24/26 mg twice daily. Due to the low blood pressure any titration of medication should be done carefully.  After extensive conversation, the consensus from the team was to continue with medical therapy and refer to CVTS for possible CABG with shared decision making.    Recommendations: CABG Medical Therapy     Ellouise Elnor Myron, RN  02/19/2024 12:28 PM

## 2024-02-20 ENCOUNTER — Encounter (HOSPITAL_COMMUNITY)

## 2024-02-22 ENCOUNTER — Telehealth: Payer: Self-pay | Admitting: Cardiology

## 2024-02-22 DIAGNOSIS — I255 Ischemic cardiomyopathy: Secondary | ICD-10-CM

## 2024-02-22 DIAGNOSIS — I25118 Atherosclerotic heart disease of native coronary artery with other forms of angina pectoris: Secondary | ICD-10-CM

## 2024-02-22 NOTE — Telephone Encounter (Signed)
 ICD-10-CM   1. Coronary artery disease of native artery of native heart with stable angina pectoris [facility])  I25.118 Ambulatory referral to Cardiothoracic Surgery    2. Ischemic cardiomyopathy  I25.5 Ambulatory referral to Cardiothoracic Surgery     Orders Placed This Encounter  Procedures   Ambulatory referral to Cardiothoracic Surgery    Referral Priority:   Routine    Referral Type:   Surgical    Referral Reason:   Specialty Services Required    Referred to Provider:   Daniel Con RAMAN, MD    Requested Specialty:   Cardiothoracic Surgery    Number of Visits Requested:   1

## 2024-02-23 ENCOUNTER — Encounter (HOSPITAL_COMMUNITY)

## 2024-02-24 NOTE — Telephone Encounter (Signed)
 Attempted to contact patient. Left message to call back on personal voicemail.

## 2024-02-25 ENCOUNTER — Encounter (HOSPITAL_COMMUNITY)

## 2024-02-27 ENCOUNTER — Encounter (HOSPITAL_COMMUNITY): Payer: Self-pay

## 2024-02-27 ENCOUNTER — Encounter (HOSPITAL_COMMUNITY)

## 2024-03-01 ENCOUNTER — Other Ambulatory Visit (HOSPITAL_BASED_OUTPATIENT_CLINIC_OR_DEPARTMENT_OTHER): Payer: Self-pay | Admitting: Cardiology

## 2024-03-01 ENCOUNTER — Encounter (HOSPITAL_COMMUNITY)

## 2024-03-01 ENCOUNTER — Ambulatory Visit (HOSPITAL_COMMUNITY)
Admission: RE | Admit: 2024-03-01 | Discharge: 2024-03-01 | Disposition: A | Discharge status: Home / Self Care | Source: Ambulatory Visit | Attending: Cardiology | Admitting: Cardiology

## 2024-03-01 DIAGNOSIS — I255 Ischemic cardiomyopathy: Secondary | ICD-10-CM | POA: Insufficient documentation

## 2024-03-01 MED ORDER — GADOBUTROL 1 MMOL/ML IV SOLN
10.0000 mL | Freq: Once | INTRAVENOUS | Status: AC | PRN
Start: 2024-03-01 — End: 2024-03-01
  Administered 2024-03-01: 10 mL via INTRAVENOUS

## 2024-03-01 NOTE — Telephone Encounter (Signed)
 Attempted to contact patient. Left message to call back on personal voicemail.

## 2024-03-02 ENCOUNTER — Ambulatory Visit: Payer: Self-pay | Admitting: Cardiology

## 2024-03-02 NOTE — Progress Notes (Signed)
 I have reviewed the coronary angiograms again.  Patient has severely diffusely diseased LAD.  If he were to get LIMA to LAD and may be a SVG to OM1, he may get long-term benefit if he will think that he is a good candidate.  Otherwise I will proceed with PCI to the LAD and consider PCI to CX-OM.  Given his diffuse disease in the LAD, suspect he probably would do better with the grafting.  Good news is that the MRI suggests anterior wall is viable

## 2024-03-03 ENCOUNTER — Encounter (HOSPITAL_COMMUNITY)

## 2024-03-03 NOTE — Progress Notes (Signed)
 [address] [place] [address]             [phone]    BARTOLO MONTANYE [facility] Medical Record #[id number] Date of Birth: 1941-02-16  Referring: [name], MD Primary Care: [name], PA-C Primary Cardiologist:Jay Ladona, MD  Chief Complaint:    Chief Complaint  Patient presents with   Coronary Artery Disease    Surgical consult, ECHO 01/27/24/ MR Cardiac 03/01/24/ Cardiac Cath 09/19/23    History of Present Illness:     Richard Mclaughlin is a 83 y.o. male who presents for surgical evaluation of multivessel coronary artery disease in the setting of STEMI in April 2025. At that time he had abdominal pain, never had chest pain.  He had pain for 2 days, presented late and had reduced EF 25-30%, so he was managed medically.  Since then he was doing cardiac rehab but that was on hold due to exertional chest pain.  His medications were adjusted and now he is walking a mile a day without chest pain.  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  On the evening of the wedding day he did take a nitroglycerin  table because he didn't feel quite right.  He felt better afterwards but has not otherwise had to use nitroglycerin .  He denies chest pain, shortness of breath, leg swelling, syncope or dizziness.  He has not had a previous stroke and his PMH includes OSA, NIDDM (A1c 6.4% in April 2025), HTL, HL and CKD (Baseline Cre 1.3)   He still feels very full of life and has travel plans with his new wife. Their goal is to be as active as possible to be able to do all the things they want to do.  He is a former 15 pack year smoker and used to smoke his pipe but does not anymore.  TTE (01/2024): EF 30-35%, no mitral or aortic valve disease MRI (03/2024): EF 39%, normal RV function, no significant valvular disease.  Basal inferior and basal to  mid inferolateral walls without viability.  Anterior wall is viable LHC (09/19/23): CTO RCA, prox LAD 80%, D1 80%, prox Cx 100% - Oms fill via collateral from LAD  Past Medical and Surgical History: Previous Chest Surgery: No Previous Chest Radiation: No Diabetes Mellitus: Yes.  HbA1C 6.4% Anticoagulation: No, Last dose N/A  Creatinine:  Lab Results  Component Value Date   CREATININE 1.32 (H) 01/13/2024   CREATININE 1.30 (H) 10/09/2023   CREATININE 1.49 (H) 09/21/2023     Past Medical History:  Diagnosis Date   CKD stage 3a, GFR 45-59 ml/min (HCC) 09/19/2023   High cholesterol    Hypertension 09/19/2023   Non-insulin  dependent type 2 diabetes mellitus (HCC) 09/19/2023   OSA (obstructive sleep apnea) 09/19/2023    Past Surgical History:  Procedure Laterality Date   LEFT HEART CATH AND CORONARY ANGIOGRAPHY N/A 09/19/2023   Procedure: LEFT HEART CATH AND CORONARY ANGIOGRAPHY;  Surgeon: [name], MD;  Location: [facility] INVASIVE [facility];  Service: Cardiovascular;  Laterality: N/A;    Social History:  Social History   Tobacco Use  Smoking Status Former   Current packs/day: 0.00   Types: Cigarettes   Quit date: 06/04/1983   Years since quitting: 40.7  Smokeless Tobacco Not on file    Social History   Substance and Sexual Activity  Alcohol Use Yes     Allergies  Allergen Reactions   Azithromycin Other (See Comments)    Hemolytic anemia   Simvastatin     Other reaction(s): Muscle Pain    Medications: Asprin: Yes Statin: Yes Beta Blocker: Yes Ace Inhibitor: Yes Anti-Coagulation: No  Current Outpatient Medications  Medication Sig Dispense Refill   acetaminophen  (TYLENOL ) 500 MG tablet Take 500-1,000 mg by mouth every 6 (six) hours as needed for moderate pain (pain score 4-6).     aspirin  EC 81 MG tablet Take 81 mg by mouth daily.     atorvastatin  (LIPITOR ) 80 MG tablet Take 1 tablet (80 mg total) by mouth daily. 90 tablet 3   carvedilol  (COREG ) 6.25 MG tablet Take  1 tablet (6.25 mg total) by mouth 2 (two) times daily. 60 tablet 3   cetirizine (ZYRTEC) 10 MG tablet Take 10 mg by mouth daily.     clopidogrel  (PLAVIX ) 75 MG tablet Take 1 tablet (75 mg total) by mouth daily. 90 tablet 3   Cyanocobalamin (B-12 PO) Take 1 tablet by mouth daily.     ezetimibe  (ZETIA ) 10 MG tablet Take 1 tablet (10 mg total) by mouth daily. 90 tablet 3   isosorbide  mononitrate (IMDUR ) 30 MG 24 hr tablet Take 1 tablet (30 mg total) by mouth daily. 30 tablet 3   Multiple Vitamin (MULTIVITAMIN WITH MINERALS) TABS tablet Take 1 tablet by mouth daily.     Multiple Vitamins-Minerals (ZINC PO) Take 1 tablet by mouth daily as needed (when starting to feel sick.).     nitroGLYCERIN  (NITROSTAT ) 0.4 MG SL tablet Place 1 tablet (0.4 mg total) under the tongue every 5 (five) minutes x 3 doses as needed for chest pain. 25 tablet 2   sacubitril -valsartan  (ENTRESTO ) 24-26 MG Take 1 tablet by mouth 2 (two) times daily. 60 tablet 11   VITAMIN D PO Take 1 tablet by mouth daily.     No current facility-administered medications for this visit.    (Not in a hospital admission)   No family history on file.   Review of Systems:   Review of Systems  Constitutional:  Negative for chills, fever and malaise/fatigue.  Respiratory:  Negative for cough and hemoptysis.   Cardiovascular:  Negative for chest pain, palpitations and leg swelling.  Gastrointestinal:  Negative for abdominal pain and vomiting.  Neurological:  Negative for dizziness and headaches.      Physical Exam: BP 104/64   Pulse 60   Resp 20   Ht 6' 1 (1.854 m)   Wt 190 lb (86.2 kg)   SpO2 96% Comment: RA  BMI 25.07 kg/m  Physical Exam Constitutional:      Appearance: Normal appearance. He is normal weight.  HENT:     Head: Normocephalic and atraumatic.  Cardiovascular:     Rate and Rhythm: Normal rate.     Heart sounds: Normal heart sounds.  Pulmonary:     Effort: Pulmonary effort is normal.     Breath sounds:  Normal breath sounds.  Abdominal:     General: There is no distension.     Palpations: Abdomen is soft.     Tenderness: There is no abdominal tenderness.  Skin:    General: Skin is warm and dry.  Neurological:     General: No focal deficit present.     Mental Status: He is alert and oriented to person, place, and time.       Diagnostic Studies & Laboratory data: Cardiac Studies & Procedures  ______________________________________________________________________________________________ CARDIAC CATHETERIZATION  CARDIAC CATHETERIZATION 09/19/2023  Conclusion Images from the original result were not included.    1st Diag lesion is 80% stenosed.   2nd Diag lesion is 20% stenosed.  Cardiac Catheterization 09/19/23: Hemodynamic data: LVEDP 18 mmHg.  Mild to moderately elevated.  No pressure gradient across the aortic valve.  Angiographic data: LV: Entire inferior and inferoapical akinesis.  LVEF 25 to 30%.  No significant mitral regurgitation. RCA: Occluded in the proximal segment.  Faint collaterals to the PDA from LAD. LM: Large-caliber vessel, normal. LAD: Diffusely diseased and mildly calcified throughout.  Proximal segment has a 80% stenosis.  Moderate-sized D-1 with ostial 80% to 90% stenosis small to moderate-sized D2 with mild 20% ostial stenosis. LCx: Occluded in the proximal segment.  OM1 which appears to be moderate to large size has faint ipsilateral collaterals.  Distal CX not seen.    Impression and recommendations: Patient presenting late with inferior and posterior STEMI, essentially living off of LAD now.  LVEF appears to be severely reduced.  Will medically optimize him, started him on Entresto , Jardiance , Coreg  and discontinue metoprolol  tartrate.  Once optimized, if he has any recurrence of angina we will consider PCI to the LAD.  Findings Coronary Findings Diagnostic  Dominance: Right  Left Anterior Descending Ost LAD to Mid LAD lesion is 80%  stenosed. Mid LAD lesion is 50% stenosed. Dist LAD lesion is 40% stenosed.  First Diagonal Branch 1st Diag lesion is 80% stenosed.  Second Diagonal Branch 2nd Diag lesion is 20% stenosed.  Left Circumflex Prox Cx lesion is 100% stenosed. Mid Cx lesion is 100% stenosed.  First Obtuse Marginal Branch Collaterals 1st Mrg filled by collaterals from Mid LAD.  Lateral First Obtuse Marginal Branch Collaterals Lat 1st Mrg filled by collaterals from Dist LAD.  Right Coronary Artery Prox RCA to Dist RCA lesion is 100% stenosed.  Right Posterior Descending Artery Collaterals RPDA filled by collaterals from Dist LAD.  Intervention  No interventions have been documented.     ECHOCARDIOGRAM  ECHOCARDIOGRAM LIMITED 01/27/2024  Narrative ECHOCARDIOGRAM LIMITED REPORT    Patient Name:   ZACKRY DEINES Date of Exam: 01/27/2024 Medical Rec #:  [id number]            Height:       73.0 in Accession #:    [id number]           Weight:       191.0 lb Date of Birth:  03/15/1941            BSA:          2.110 m Patient Age:    82 years             BP:           104/52 mmHg Patient Gender: M                    HR:           55 bpm. Exam Location:  [address]  Procedure: Limited Echo and Intracardiac Opacification Agent (Both Spectral and Color Flow Doppler were utilized during procedure).  Indications:    I25.5 Ischemic Cardiomyopathy  History:        Patient has prior history of Echocardiogram examinations, most recent 12/14/2023. Risk Factors:Sleep Apnea, Former Smoker, Family History of Coronary Artery Disease, Hypertension, Diabetes and Dyslipidemia.  Sonographer:    Heather Hawks RDCS Referring Phys: CAITLIN S WALKER  IMPRESSIONS   1. Limited echocardiogram -  for details reference the full study 12/09/2023. 2. The inferior wall and posterior wall are akinetic. The anterior wall, antero-lateral wall, mid inferoseptal segment, apical inferior segment, and basal  inferoseptal segment are hypokinetic. 3. Left ventricular ejection fraction, by estimation, is 30 to 35%. The left ventricle has moderately decreased function. 4. The mitral valve is grossly normal. 5. The aortic valve is grossly normal.  Conclusion(s)/Recommendation(s): Consider cMRI to further evaluate the LVEF, RWMAs, and viable myocardium.  FINDINGS Left Ventricle: Left ventricular ejection fraction, by estimation, is 30 to 35%. The left ventricle has moderately decreased function. Definity  contrast agent was given IV to delineate the left ventricular endocardial borders.   LV Wall Scoring: The inferior wall and posterior wall are akinetic. The anterior wall, antero-lateral wall, mid inferoseptal segment, apical inferior segment, and basal inferoseptal segment are hypokinetic.  Mitral Valve: The mitral valve is grossly normal.  Tricuspid Valve: The tricuspid valve is grossly normal.  Aortic Valve: The aortic valve is grossly normal.  Sunit Tolia Electronically signed by Madonna Large Signature Date/Time: 01/27/2024/11:02:27 PM    Final        CARDIAC MRI  MR CARDIAC MORPHOLOGY W WO CONTRAST 03/01/2024  Narrative CLINICAL DATA:  Ischemic cardiomyopathy, assess for viability.  EXAM: CARDIAC MRI  TECHNIQUE: The patient was scanned on a 1.5 Tesla GE magnet. A dedicated cardiac coil was used. Functional imaging was done using Fiesta sequences. 2,3, and 4 chamber views were done to assess for RWMA's. Modified Simpson's rule using a short axis stack was used to calculate an ejection fraction on a dedicated work [employer]. The patient received 10 cc of Gadavist. After 10 minutes inversion recovery sequences were used to assess for infiltration and scar tissue.  FINDINGS: Limited images of the lung fields showed no gross abnormalities.  Normal left ventricular size and wall thickness. Akinesis of the basal inferior wall and the basal to mid  inferolateral wall. Hypokinesis of the mid anterolateral wall. Overall LV EF 39%. Normal right ventricular size and systolic function, RV EF 52%. Normal left and right atrial sizes. Trileaflet, calcified/thickened aortic valve. There may be mild stenosis present but would quantify by echo. Trivial aortic insufficiency, aortic regurgitant fraction 2%. No significant mitral regurgitation.  DELAYED ENHANCEMENT: DELAYED ENHANCEMENT >50% wall thickness subendocardial late gadolinium enhancement (LGE) in the basal inferior wall.  >50% wall thickness subendocardial LGE in the basal to mid inferolateral wall.  <50% wall thickness subendocardial LGE in the mid anterolateral wall.  MEASUREMENTS: MEASUREMENTS LVEDV 141 mL  LVEDVi 67 mL/m2  LVSV 55 mL  LVEF 39%  RVEDV 114 mL  RVEDVi 54 mL/m2  RVSV 59 mL RVEF 52%  Aortic forward volume 56 mL  Aortic regurgitant fraction 2%  Global T1 1070, ECV 29% (within normal range)  IMPRESSION: 1. Normal LV size with akinesis of the basal inferior wall and basal to mid inferolateral wall, hypokinesis of the mid anterolateral wall. LV EF 39%.  2.  Normal RV size and systolic function, RV EF 52%.  3. Delayed enhancement pattern suggests prior myocardial infarction. The basal inferior wall and the basal to mid inferolateral wall are unlikely to improve with revascularization (unlikely to be viable). The mid anterolateral wall has < 50% wall thickness LGE, may be able to improve with revascularization. The remainder of the LV wall segments have no scar and thicken normally.  Dalton Mclean   Electronically Signed By: Ezra Shuck M.D. On: 03/02/2024 14:02   ______________________________________________________________________________________________     EKG:  NSR I have independently reviewed the above radiologic studies and discussed with the patient   Recent Lab Findings: Lab Results  Component Value Date   WBC 5.4  01/13/2024   HGB 14.2 02/12/2024   HCT 43.5 02/12/2024   PLT 170 01/13/2024   GLUCOSE 119 (H) 01/13/2024   CHOL 122 01/13/2024   TRIG 166 (H) 01/13/2024   HDL 35 (L) 01/13/2024   LDLCALC 59 01/13/2024   ALT 18 01/13/2024   AST 22 01/13/2024   NA 143 01/13/2024   K 5.0 01/13/2024   CL 105 01/13/2024   CREATININE 1.32 (H) 01/13/2024   BUN 18 01/13/2024   CO2 21 01/13/2024   HGBA1C 6.4 (H) 09/19/2023      Assessment / Plan:   Mr. Peasley is a very pleasant 83 year old man who presented in April with an MI - medical management was performed at that time.  Since then, he has been doing well and remains active.  Recent cMRI demonstrated non-viability of his inferior wall but he does have viability of his anterior wall.  His LHC shows multivessel disease, but his targets are poor and given his age, I'm not sure surgery is his best option.  He and his wife really want to be able to make the most of the time they have, and I think while he would probably do fine with surgery, it would set him back in terms of his ability to get back to traveling  He told he was advised to double his entresto  after his LHC, but I recommended against that today given his borderline BP (104/64) and he describes a little bit of orthostasis (dizziness when he gets up too fast).  I explained that I would like to discuss his case again at Heart Team on Friday in order to determine whether CABG or PCI would be the best option for him.  He says he prefers PCI if it's an option.  If we pursue surgery, he will need a non-contrast CT.  If PCI, we will skip the CT scan.  I  spent 45 minutes counseling the patient face to face.   Con RAMAN [place] 03/04/2024 10:48 AM

## 2024-03-04 ENCOUNTER — Ambulatory Visit

## 2024-03-04 VITALS — BP 104/64 | HR 60 | Resp 20 | Ht 73.0 in | Wt 190.0 lb

## 2024-03-04 DIAGNOSIS — I2510 Atherosclerotic heart disease of native coronary artery without angina pectoris: Secondary | ICD-10-CM

## 2024-03-05 ENCOUNTER — Encounter (HOSPITAL_COMMUNITY)

## 2024-03-05 ENCOUNTER — Ambulatory Visit (HOSPITAL_COMMUNITY): Payer: Self-pay

## 2024-03-05 NOTE — Heart Team MDD (Signed)
   Heart Team Multi-Disciplinary Discussion  Patient: Richard Mclaughlin  DOB: 05-13-1941  MRN: [id number]   Date: 03/05/2024  2:32 PM    Attendees: Interventional Cardiology: Lurena Red, MD Lonni Hanson, MD Cara Lovelace, MD Alm Clay, MD Gordy Bergamo, MD Ozell Fell, MD Newman Lawrence, MD Deatrice Cage, MD Debby Como, MD    Additional Attendees: Con Clunes, MD Madonna Large, DO   Patient History: 83 y.o. male with multivessel coronary artery disease, OSA, NIDDM, HTL, HL and CKD. He very active and walks 1 mile each morning then does woodwork in his shop. He used nitroglycerin  once for not feeling quite right and it helped. He is a newlywed and wants to travel.    Risk Factors: Hypertension Hyperlipidemia Diabetes Mellitus Chronic Kidney Disease Additional Risk Factors: OSA   CAD History: Presented in early April with late inferior and posterior STEMI and cardiac catheterization on 09/19/2023 revealing CTO RCA and CX with collaterals from LAD and proximal LAD had a 80% and D1 80 to 90% stenosis. At that time, recommended medical therapy in view of late presentation and EF 25 to 30% with consideration for revascularization of the LAD with PCI or CABG once stabilized from MI.     Review of Prior Angiography and PCI Procedures: His cath films from 09/19/2023 was reviewed and discussed in detail. He has CTO RCA, prox LAD 80%, prox LCx 100% and OMs fill via collateral from the LAD. MRI from 03/01/24 shows viability of the anterior wall and overall LV EF 39%.   Discussion: After presentation, consideration of treatment options occurred contrasting continuation of medical therapy, CABG or PCI. He has been on good medical therapy. After extensive conversation, the consensus from the team was to schedule him for a PCI to the LAD with potential option of CABG in the future if he starts to have progressive angina.   Recommendations: PCI     Shona Palma, RN  03/05/2024 2:32 PM

## 2024-03-08 ENCOUNTER — Encounter (HOSPITAL_COMMUNITY)

## 2024-03-10 ENCOUNTER — Encounter (HOSPITAL_COMMUNITY)

## 2024-03-10 ENCOUNTER — Telehealth: Payer: Self-pay | Admitting: Cardiology

## 2024-03-10 DIAGNOSIS — Z01812 Encounter for preprocedural laboratory examination: Secondary | ICD-10-CM

## 2024-03-10 NOTE — Telephone Encounter (Signed)
 Richard Mclaughlin, after discussion with the Heart Team and CT surgery, it was felt best option was PCI and stenting to LAD, can you please arrange this

## 2024-03-10 NOTE — Telephone Encounter (Signed)
 Will forward to Dr Ladona for review.  Pt has also be referred to CVTS ordered 02/22/24 but not scheduled at this time.

## 2024-03-10 NOTE — Telephone Encounter (Signed)
 Patient calling in about the stint that was discuss with Dr. Ladona. Calling to see when it will be schedule. Please advise

## 2024-03-12 ENCOUNTER — Encounter (HOSPITAL_COMMUNITY)

## 2024-03-12 NOTE — Telephone Encounter (Signed)
 Called and spoke with patient. Advised patient that was unable to schedule a time for Thursday, October 16th and patient was scheduled for Friday, October 17th with 5:30 am arrival time. Reviewed patient instructions over the phone and sent a copy of instructions to patient on MyChart. Patient verbalized understanding and agreeable to plan.

## 2024-03-12 NOTE — Telephone Encounter (Signed)
 Patient was seen 03/04/24 by Daniel Con RAMAN, MD.

## 2024-03-12 NOTE — Telephone Encounter (Signed)
 Called and spoke with patient. Patient requested Thursday, October 16th for procedure date. Advised patient cath lab will be called to schedule and he will receive a call back to go over instructions. Patient verbalized understanding and agreeable to plan.

## 2024-03-15 ENCOUNTER — Encounter (HOSPITAL_COMMUNITY)

## 2024-03-16 ENCOUNTER — Telehealth: Payer: Self-pay | Admitting: *Deleted

## 2024-03-16 NOTE — Telephone Encounter (Addendum)
 Coronary Stent scheduled at [facility] for: Friday March 19, 2024 7:30 AM Arrival time [facility] Main Entrance A at: 5:30 AM  Diet: -Nothing to eat after midnight.  Hydration: -May drink clear liquids until 2 hours before the procedure.  Approved liquids: Water, clear tea, black coffee, fruit juices-non-citric and without pulp,Gatorade, plain Jello/popsicles.  See note below about IV hydration prior to procedure.  Medication instructions: -Hold:  Entresto -day before and day of procedure-per protocol GFR < 60 -Other usual morning medications can be taken including aspirin  81 mg and Plavix  75 mg  Plan to go home the same day, you will only stay overnight if medically necessary.  You must have responsible adult to drive you home.  Someone must be with you the first 24 hours after you arrive home.  Reviewed procedure instructions with patient. Patient tells me he plans to get BMP/CBC tomorrow 10/15.   Per 03/16/24 Staff Message from Dr Ladona: LVEF is around 35% although he is not in any clinical heart failure-order 1 mL/kg for 1 hour.

## 2024-03-17 ENCOUNTER — Encounter (HOSPITAL_COMMUNITY)

## 2024-03-18 ENCOUNTER — Ambulatory Visit: Payer: Self-pay | Admitting: Cardiology

## 2024-03-18 LAB — BASIC METABOLIC PANEL WITH GFR
BUN/Creatinine Ratio: 18 (ref 10–24)
BUN: 24 mg/dL (ref 8–27)
CO2: 22 mmol/L (ref 20–29)
Calcium: 9.5 mg/dL (ref 8.6–10.2)
Chloride: 102 mmol/L (ref 96–106)
Creatinine, Ser: 1.3 mg/dL — ABNORMAL HIGH (ref 0.76–1.27)
Glucose: 135 mg/dL — ABNORMAL HIGH (ref 70–99)
Potassium: 4.9 mmol/L (ref 3.5–5.2)
Sodium: 138 mmol/L (ref 134–144)
eGFR: 55 mL/min/1.73 — ABNORMAL LOW (ref >59)

## 2024-03-18 LAB — CBC
Hematocrit: 41 % (ref 37.5–51.0)
Hemoglobin: 13.2 g/dL (ref 13.0–17.7)
MCH: 31.7 pg (ref 26.6–33.0)
MCHC: 32.2 g/dL (ref 31.5–35.7)
MCV: 99 fL — ABNORMAL HIGH (ref 79–97)
Platelets: 161 x10E3/uL (ref 150–450)
RBC: 4.16 x10E6/uL (ref 4.14–5.80)
RDW: 12.7 % (ref 11.6–15.4)
WBC: 5.8 x10E3/uL (ref 3.4–10.8)

## 2024-03-19 ENCOUNTER — Ambulatory Visit (HOSPITAL_COMMUNITY)
Admission: RE | Disposition: A | Discharge status: Home / Self Care | Payer: Self-pay | Source: Home / Self Care | Attending: Cardiology

## 2024-03-19 ENCOUNTER — Ambulatory Visit (HOSPITAL_COMMUNITY)
Admission: RE | Admit: 2024-03-19 | Discharge: 2024-03-19 | Disposition: A | Discharge status: Home / Self Care | Attending: Cardiology | Admitting: Cardiology

## 2024-03-19 ENCOUNTER — Other Ambulatory Visit: Payer: Self-pay

## 2024-03-19 ENCOUNTER — Encounter (HOSPITAL_COMMUNITY)

## 2024-03-19 DIAGNOSIS — I252 Old myocardial infarction: Secondary | ICD-10-CM | POA: Insufficient documentation

## 2024-03-19 DIAGNOSIS — I255 Ischemic cardiomyopathy: Secondary | ICD-10-CM | POA: Insufficient documentation

## 2024-03-19 DIAGNOSIS — Z7984 Long term (current) use of oral hypoglycemic drugs: Secondary | ICD-10-CM | POA: Insufficient documentation

## 2024-03-19 DIAGNOSIS — I2510 Atherosclerotic heart disease of native coronary artery without angina pectoris: Secondary | ICD-10-CM

## 2024-03-19 DIAGNOSIS — I10 Essential (primary) hypertension: Secondary | ICD-10-CM | POA: Diagnosis present

## 2024-03-19 DIAGNOSIS — E119 Type 2 diabetes mellitus without complications: Secondary | ICD-10-CM

## 2024-03-19 DIAGNOSIS — I2584 Coronary atherosclerosis due to calcified coronary lesion: Secondary | ICD-10-CM | POA: Diagnosis not present

## 2024-03-19 DIAGNOSIS — Z87891 Personal history of nicotine dependence: Secondary | ICD-10-CM | POA: Diagnosis not present

## 2024-03-19 DIAGNOSIS — E785 Hyperlipidemia, unspecified: Secondary | ICD-10-CM | POA: Diagnosis present

## 2024-03-19 DIAGNOSIS — N1831 Chronic kidney disease, stage 3a: Secondary | ICD-10-CM | POA: Diagnosis present

## 2024-03-19 DIAGNOSIS — I5022 Chronic systolic (congestive) heart failure: Secondary | ICD-10-CM | POA: Diagnosis not present

## 2024-03-19 DIAGNOSIS — Z955 Presence of coronary angioplasty implant and graft: Secondary | ICD-10-CM

## 2024-03-19 DIAGNOSIS — E1122 Type 2 diabetes mellitus with diabetic chronic kidney disease: Secondary | ICD-10-CM | POA: Insufficient documentation

## 2024-03-19 DIAGNOSIS — I130 Hypertensive heart and chronic kidney disease with heart failure and stage 1 through stage 4 chronic kidney disease, or unspecified chronic kidney disease: Secondary | ICD-10-CM | POA: Insufficient documentation

## 2024-03-19 DIAGNOSIS — I25118 Atherosclerotic heart disease of native coronary artery with other forms of angina pectoris: Secondary | ICD-10-CM

## 2024-03-19 DIAGNOSIS — Z9861 Coronary angioplasty status: Secondary | ICD-10-CM

## 2024-03-19 DIAGNOSIS — E7800 Pure hypercholesterolemia, unspecified: Secondary | ICD-10-CM

## 2024-03-19 LAB — GLUCOSE, CAPILLARY
Glucose-Capillary: 112 mg/dL — ABNORMAL HIGH (ref 70–99)
Glucose-Capillary: 121 mg/dL — ABNORMAL HIGH (ref 70–99)

## 2024-03-19 LAB — POCT ACTIVATED CLOTTING TIME
Activated Clotting Time: 245 s
Activated Clotting Time: 446 s
Activated Clotting Time: 481 s
Activated Clotting Time: 262 s
Activated Clotting Time: 297 s

## 2024-03-19 SURGERY — CORONARY STENT INTERVENTION
Anesthesia: LOCAL

## 2024-03-19 MED ORDER — NITROGLYCERIN 1 MG/10 ML FOR IR/CATH LAB
INTRA_ARTERIAL | Status: AC
  Filled 2024-03-19: qty 10

## 2024-03-19 MED ORDER — HEPARIN (PORCINE) IN NACL 1000-0.9 UT/500ML-% IV SOLN
INTRAVENOUS | Status: DC | PRN
  Administered 2024-03-19 (×2): 500 mL

## 2024-03-19 MED ORDER — HEPARIN SODIUM (PORCINE) 1000 UNIT/ML IJ SOLN
INTRAMUSCULAR | Status: AC
  Filled 2024-03-19: qty 10

## 2024-03-19 MED ORDER — CLOPIDOGREL BISULFATE 75 MG PO TABS: 75.0000 mg | ORAL_TABLET | ORAL | Status: AC

## 2024-03-19 MED ORDER — LIDOCAINE HCL (PF) 1 % IJ SOLN
INTRAMUSCULAR | Status: DC | PRN
  Administered 2024-03-19: 2 mL

## 2024-03-19 MED ORDER — SODIUM CHLORIDE 0.9 % IV SOLN: INTRAVENOUS | Status: AC

## 2024-03-19 MED ORDER — ASPIRIN 81 MG PO CHEW
81.0000 mg | CHEWABLE_TABLET | ORAL | Status: AC
  Administered 2024-03-19: 81 mg via ORAL
  Filled 2024-03-19: qty 1

## 2024-03-19 MED ORDER — SODIUM CHLORIDE 0.9% FLUSH: 3.0000 mL | INTRAVENOUS | Status: DC | PRN

## 2024-03-19 MED ORDER — SODIUM CHLORIDE 0.9% FLUSH: 3.0000 mL | Freq: Two times a day (BID) | INTRAVENOUS | Status: DC

## 2024-03-19 MED ORDER — SODIUM CHLORIDE 0.9 % IV SOLN: 250.0000 mL | INTRAVENOUS | Status: DC | PRN

## 2024-03-19 MED ORDER — ACETAMINOPHEN 325 MG PO TABS: 650.0000 mg | ORAL_TABLET | ORAL | Status: DC | PRN

## 2024-03-19 MED ORDER — FENTANYL CITRATE (PF) 100 MCG/2ML IJ SOLN
INTRAMUSCULAR | Status: AC
  Filled 2024-03-19: qty 2

## 2024-03-19 MED ORDER — ONDANSETRON HCL 4 MG/2ML IJ SOLN: 4.0000 mg | Freq: Four times a day (QID) | INTRAMUSCULAR | Status: DC | PRN

## 2024-03-19 MED ORDER — VERAPAMIL HCL 2.5 MG/ML IV SOLN
INTRAVENOUS | Status: DC | PRN
  Administered 2024-03-19: 10 mL via INTRA_ARTERIAL

## 2024-03-19 MED ORDER — FENTANYL CITRATE (PF) 100 MCG/2ML IJ SOLN
INTRAMUSCULAR | Status: DC | PRN
  Administered 2024-03-19: 25 ug via INTRAVENOUS

## 2024-03-19 MED ORDER — SODIUM CHLORIDE 0.9 % IV SOLN
INTRAVENOUS | Status: DC | PRN
  Administered 2024-03-19: 20 mL via SURGICAL_CAVITY

## 2024-03-19 MED ORDER — SODIUM CHLORIDE 0.9 % WEIGHT BASED INFUSION: 1.0000 mL/kg/h | INTRAVENOUS | Status: DC

## 2024-03-19 MED ORDER — HEPARIN SODIUM (PORCINE) 1000 UNIT/ML IJ SOLN
INTRAMUSCULAR | Status: DC | PRN
  Administered 2024-03-19: 8000 [IU] via INTRAVENOUS
  Administered 2024-03-19: 3000 [IU] via INTRAVENOUS

## 2024-03-19 MED ORDER — MIDAZOLAM HCL 2 MG/2ML IJ SOLN
INTRAMUSCULAR | Status: AC
  Filled 2024-03-19: qty 2

## 2024-03-19 MED ORDER — LIDOCAINE HCL (PF) 1 % IJ SOLN
INTRAMUSCULAR | Status: AC
  Filled 2024-03-19: qty 30

## 2024-03-19 MED ORDER — MIDAZOLAM HCL (PF) 2 MG/2ML IJ SOLN
INTRAMUSCULAR | Status: DC | PRN
  Administered 2024-03-19: 2 mg via INTRAVENOUS

## 2024-03-19 MED ORDER — SODIUM CHLORIDE 0.9 % IV BOLUS
INTRAVENOUS | Status: DC | PRN
  Administered 2024-03-19: 250 mL via INTRAVENOUS

## 2024-03-19 MED ADMIN — Iohexol IV Soln 350 MG/ML: 120 mL | NDC 00407141491

## 2024-03-19 MED ADMIN — Sodium Chloride IV Soln 0.9%: 1 mL/kg/h | INTRAVENOUS | NDC 00338004904

## 2024-03-19 MED FILL — Verapamil HCl IV Soln 2.5 MG/ML: INTRAVENOUS | Qty: 2 | Status: AC

## 2024-03-19 SURGICAL SUPPLY — 20 items
BALLOON SAPPHIRE 2.0X20 (BALLOONS) IMPLANT
BALLOON SAPPHIRE NC24 3.5X12 (BALLOONS) IMPLANT
BALLOON SCOREFLEX 3.0X15 (BALLOONS) IMPLANT
CATH OPTICROSS HD (CATHETERS) IMPLANT
CATH VISTA GUIDE 6FR XBLD 3.5 (CATHETERS) IMPLANT
CROWN DIAMONDBACK CLASSIC 1.25 (BURR) IMPLANT
DEVICE RAD COMP TR BAND LRG (VASCULAR PRODUCTS) IMPLANT
DRAPE IVUS SLED (BAG) IMPLANT
GLIDESHEATH SLEND SS 6F .021 (SHEATH) IMPLANT
GUIDEWIRE INQWIRE 1.5J.035X260 (WIRE) IMPLANT
KIT ENCORE 26 ADVANTAGE (KITS) IMPLANT
KIT HEMO VALVE WATCHDOG (MISCELLANEOUS) IMPLANT
KIT SINGLE USE MANIFOLD (KITS) IMPLANT
KIT SYRINGE INJ CVI SPIKEX1 (MISCELLANEOUS) IMPLANT
LUBRICANT VIPERSLIDE CORONARY (MISCELLANEOUS) IMPLANT
PACK CARDIAC CATHETERIZATION (CUSTOM PROCEDURE TRAY) ×1 IMPLANT
SET ATX-X65L (MISCELLANEOUS) IMPLANT
STENT SYNERGY XD 3.50X20 (Permanent Stent) IMPLANT
WIRE RUNTHROUGH .014X180CM (WIRE) IMPLANT
WIRE VIPERWIRE COR FLEX .012 (WIRE) IMPLANT

## 2024-03-19 NOTE — Discharge Summary (Cosign Needed Addendum)
 Discharge Summary for Same Day PCI   Patient ID: Richard Mclaughlin MRN: [id number]; DOB: [birth year]  Admit date: 03/19/2024 Discharge date: 03/19/2024  Primary Care Provider: [name], PA-C  Primary Cardiologist: Gordy Bergamo, MD  Primary Electrophysiologist:  None   Discharge Diagnoses    Principal Problem:   CAD in native artery Active Problems:   CKD stage 3a, GFR 45-59 ml/min (HCC)   Hypertension   Non-insulin  dependent type 2 diabetes mellitus (HCC)   Hyperlipidemia   Chronic systolic CHF (congestive heart failure) (HCC)    Diagnostic Studies/Procedures    Cardiac Catheterization 03/19/2024:  Coronary angioplasty 03/19/2024: Successful IVUS guided atherectomy and followed by stenting of proximal LAD with 3.5 x 20 mm Synergy XD DES extending into the LM, minimal luminal area increased from 2.9 mm to 7.31 mm.  TIMI-3 to TIMI-3 flow maintained.  Past improvement in collaterals to CX and specifically to RCA postangioplasty.        Recommendation: Continue aspirin  for 1 year and Plavix  indefinitely. _____________   History of Present Illness     Richard Mclaughlin is a 83 y.o. male with CAD, pre-DM, CKD stage 3, HTN, HLD, OSA on CPAP, chronic HFrEF. Per records, he presented with late inferior and posterior STEMI and cardiac catheterization on 09/19/2023 revealing CTO RCA and CX with collaterals from LAD and proximal LAD had a 80% and D1 80 to 90% stenosis and recommended medical therapy in view of late presentation and EF 25 to 30% with consideration for revascularization of the LAD once stabilized from myocardial infarction. In spite of aggressive medical therapy and also the fact that he has proximal LAD disease with occluded CX and RCA, he was referred for evaluation for surgical bypass, however he was turned down and felt to be high risk and consensus plan was to proceed with PCI to the proximal LAD after heart team meeting. Cardiac catheterization  was arranged for further evaluation.  Hospital Course     The patient underwent cardiac cath as noted above with  IVUS guided atherectomy and followed by stenting of proximal LAD. Plan for aspirin  for 1 year and Plavix  indefinitely. The patient was seen by cardiac rehab while in short stay. There were no observed complications post cath. Radial cath site was re-evaluated prior to discharge and found to be stable without any complications. Instructions/precautions regarding cath site care were given prior to discharge.  Richard Mclaughlin was seen by Dr. Bergamo and determined stable for discharge home. Follow up with our office has been arranged. Medications are listed below. Reiterated importance of DAPT. Pertinent changes include none. Asked nurse to re-print AVS after I finalized discharge instructions.  Cath/PCI Registry Performance & Quality Measures: Aspirin  prescribed? - Yes ADP Receptor Inhibitor (Plavix /Clopidogrel , Brilinta/Ticagrelor or Effient/Prasugrel) prescribed (includes medically managed patients)? - Yes High Intensity Statin (Lipitor  40-80mg  or Crestor 20-40mg ) prescribed? - Yes For EF <40%, was ACEI/ARB prescribed? - Yes For EF <40%, Aldosterone Antagonist (Spironolactone or Eplerenone) prescribed? - No - Reason:  CKD, can evaluate outpatient Cardiac Rehab Phase II ordered (Included Medically managed Patients)? - Yes  _____________   Discharge Vitals Blood pressure 113/70, pulse 63, temperature (!) 97.4 F (36.3 C), resp. rate 18, height 6' 1 (1.854 m), weight 83.5 kg, SpO2 95%.  Filed Weights   03/19/24 0500 03/19/24 0600  Weight: 83.5 kg 83.5 kg    Last Labs & Radiologic Studies    CBC Recent Labs    03/17/24 1310  WBC 5.8  HGB 13.2  HCT 41.0  MCV 99*  PLT 161   Basic Metabolic Panel Recent Labs    89/84/74 1310  NA 138  K 4.9  CL 102  CO2 22  GLUCOSE 135*  BUN 24  CREATININE 1.30*  CALCIUM  9.5   _____________  CARDIAC CATHETERIZATION Result  Date: 03/19/2024 Images from the original result were not included. Coronary angioplasty 03/19/2024: Successful IVUS guided atherectomy and followed by stenting of proximal LAD with 3.5 x 20 mm Synergy XD DES extending into the LM, minimal luminal area increased from 2.9 mm to 7.31 mm.  TIMI-3 to TIMI-3 flow maintained.  Past improvement in collaterals to CX and specifically to RCA postangioplasty.     Recommendation: Continue aspirin  for 1 year and Plavix  indefinitely.   MR CARDIAC VELOCITY FLOW MAP Result Date: 03/02/2024 CLINICAL DATA:  Ischemic cardiomyopathy, assess for viability. EXAM: CARDIAC MRI TECHNIQUE: The patient was scanned on a 1.5 Tesla GE magnet. A dedicated cardiac coil was used. Functional imaging was done using Fiesta sequences. 2,3, and 4 chamber views were done to assess for RWMA's. Modified Simpson's rule using a short axis stack was used to calculate an ejection fraction on a dedicated work [employer], [employer]. The patient received 10 cc of Gadavist. After 10 minutes inversion recovery sequences were used to assess for infiltration and scar tissue. FINDINGS: Limited images of the lung fields showed no gross abnormalities. Normal left ventricular size and wall thickness. Akinesis of the basal inferior wall and the basal to mid inferolateral wall. Hypokinesis of the mid anterolateral wall. Overall LV EF 39%. Normal right ventricular size and systolic function, RV EF 52%. Normal left and right atrial sizes. Trileaflet, calcified/thickened aortic valve. There may be mild stenosis present but would quantify by echo. Trivial aortic insufficiency, aortic regurgitant fraction 2%. No significant mitral regurgitation. DELAYED ENHANCEMENT: DELAYED ENHANCEMENT >50% wall thickness subendocardial late gadolinium enhancement (LGE) in the basal inferior wall. >50% wall thickness subendocardial LGE in the basal to mid inferolateral wall. <50% wall thickness subendocardial LGE in the mid  anterolateral wall. MEASUREMENTS: MEASUREMENTS LVEDV 141 mL LVEDVi 67 mL/m2 LVSV 55 mL LVEF 39% RVEDV 114 mL RVEDVi 54 mL/m2 RVSV 59 mL RVEF 52% Aortic forward volume 56 mL Aortic regurgitant fraction 2% Global T1 1070, ECV 29% (within normal range) IMPRESSION: 1. Normal LV size with akinesis of the basal inferior wall and basal to mid inferolateral wall, hypokinesis of the mid anterolateral wall. LV EF 39%. 2.  Normal RV size and systolic function, RV EF 52%. 3. Delayed enhancement pattern suggests prior myocardial infarction. The basal inferior wall and the basal to mid inferolateral wall are unlikely to improve with revascularization (unlikely to be viable). The mid anterolateral wall has < 50% wall thickness LGE, may be able to improve with revascularization. The remainder of the LV wall segments have no scar and thicken normally. Dalton Mclean Electronically Signed   By: Ezra Shuck M.D.   On: 03/02/2024 14:03   MR CARDIAC VELOCITY FLOW MAP Result Date: 03/02/2024 CLINICAL DATA:  Ischemic cardiomyopathy, assess for viability. EXAM: CARDIAC MRI TECHNIQUE: The patient was scanned on a 1.5 Tesla GE magnet. A dedicated cardiac coil was used. Functional imaging was done using Fiesta sequences. 2,3, and 4 chamber views were done to assess for RWMA's. Modified Simpson's rule using a short axis stack was used to calculate an ejection fraction on a dedicated work [employer] officer, [employer]. The patient received 10 cc of Gadavist. After 10 minutes inversion  recovery sequences were used to assess for infiltration and scar tissue. FINDINGS: Limited images of the lung fields showed no gross abnormalities. Normal left ventricular size and wall thickness. Akinesis of the basal inferior wall and the basal to mid inferolateral wall. Hypokinesis of the mid anterolateral wall. Overall LV EF 39%. Normal right ventricular size and systolic function, RV EF 52%. Normal left and right atrial sizes. Trileaflet,  calcified/thickened aortic valve. There may be mild stenosis present but would quantify by echo. Trivial aortic insufficiency, aortic regurgitant fraction 2%. No significant mitral regurgitation. DELAYED ENHANCEMENT: DELAYED ENHANCEMENT >50% wall thickness subendocardial late gadolinium enhancement (LGE) in the basal inferior wall. >50% wall thickness subendocardial LGE in the basal to mid inferolateral wall. <50% wall thickness subendocardial LGE in the mid anterolateral wall. MEASUREMENTS: MEASUREMENTS LVEDV 141 mL LVEDVi 67 mL/m2 LVSV 55 mL LVEF 39% RVEDV 114 mL RVEDVi 54 mL/m2 RVSV 59 mL RVEF 52% Aortic forward volume 56 mL Aortic regurgitant fraction 2% Global T1 1070, ECV 29% (within normal range) IMPRESSION: 1. Normal LV size with akinesis of the basal inferior wall and basal to mid inferolateral wall, hypokinesis of the mid anterolateral wall. LV EF 39%. 2.  Normal RV size and systolic function, RV EF 52%. 3. Delayed enhancement pattern suggests prior myocardial infarction. The basal inferior wall and the basal to mid inferolateral wall are unlikely to improve with revascularization (unlikely to be viable). The mid anterolateral wall has < 50% wall thickness LGE, may be able to improve with revascularization. The remainder of the LV wall segments have no scar and thicken normally. Dalton Mclean Electronically Signed   By: Ezra Shuck M.D.   On: 03/02/2024 14:02   MR CARDIAC MORPHOLOGY W WO CONTRAST Result Date: 03/02/2024 CLINICAL DATA:  Ischemic cardiomyopathy, assess for viability. EXAM: CARDIAC MRI TECHNIQUE: The patient was scanned on a 1.5 Tesla GE magnet. A dedicated cardiac coil was used. Functional imaging was done using Fiesta sequences. 2,3, and 4 chamber views were done to assess for RWMA's. Modified Simpson's rule using a short axis stack was used to calculate an ejection fraction on a dedicated work [employer] officer, [employer]. The patient received 10 cc of Gadavist. After 10 minutes  inversion recovery sequences were used to assess for infiltration and scar tissue. FINDINGS: Limited images of the lung fields showed no gross abnormalities. Normal left ventricular size and wall thickness. Akinesis of the basal inferior wall and the basal to mid inferolateral wall. Hypokinesis of the mid anterolateral wall. Overall LV EF 39%. Normal right ventricular size and systolic function, RV EF 52%. Normal left and right atrial sizes. Trileaflet, calcified/thickened aortic valve. There may be mild stenosis present but would quantify by echo. Trivial aortic insufficiency, aortic regurgitant fraction 2%. No significant mitral regurgitation. DELAYED ENHANCEMENT: DELAYED ENHANCEMENT >50% wall thickness subendocardial late gadolinium enhancement (LGE) in the basal inferior wall. >50% wall thickness subendocardial LGE in the basal to mid inferolateral wall. <50% wall thickness subendocardial LGE in the mid anterolateral wall. MEASUREMENTS: MEASUREMENTS LVEDV 141 mL LVEDVi 67 mL/m2 LVSV 55 mL LVEF 39% RVEDV 114 mL RVEDVi 54 mL/m2 RVSV 59 mL RVEF 52% Aortic forward volume 56 mL Aortic regurgitant fraction 2% Global T1 1070, ECV 29% (within normal range) IMPRESSION: 1. Normal LV size with akinesis of the basal inferior wall and basal to mid inferolateral wall, hypokinesis of the mid anterolateral wall. LV EF 39%. 2.  Normal RV size and systolic function, RV EF 52%. 3. Delayed enhancement pattern suggests prior myocardial infarction.  The basal inferior wall and the basal to mid inferolateral wall are unlikely to improve with revascularization (unlikely to be viable). The mid anterolateral wall has < 50% wall thickness LGE, may be able to improve with revascularization. The remainder of the LV wall segments have no scar and thicken normally. Dalton Mclean Electronically Signed   By: Ezra Shuck M.D.   On: 03/02/2024 14:02   Disposition   Pt is being discharged home today in good condition.  Follow-up Plans &  Appointments     Follow-up Information     Emelia Josefa HERO, NP Follow up.   Specialty: Cardiology Why: Richard Mclaughlin - cardiology follow-up scheduled with nurse practitioner Josefa Emelia on Wednesday Mar 31, 2024 8:50 AM (Arrive by 8:30 AM) Contact information: [address] [place] [address] [phone]                Discharge Instructions     AMB Referral to Cardiac Rehabilitation - Phase II   Complete by: As directed    Diagnosis:  STEMI Stable Angina Heart Failure (see criteria below if ordering Phase II) Coronary Stents     Heart Failure Type: Chronic Systolic   After initial evaluation and assessments completed: Virtual Based Care may be provided alone or in conjunction with Phase 2 Cardiac Rehab based on patient barriers.: Yes   Intensive Cardiac Rehabilitation (ICR) [facility] location only OR Traditional Cardiac Rehabilitation (TCR) *If criteria for ICR are not met will enroll in TCR [facility] only): Yes   Diet - low sodium heart healthy   Complete by: As directed    Increase activity slowly   Complete by: As directed    Please see attached sheet at the end of your After-Visit Summary for instructions on wound care, activity, and bathing.        Discharge Medications   Allergies as of 03/19/2024       Reactions   Azithromycin Other (See Comments)   Hemolytic anemia   Simvastatin     Muscle Pain        Medication List     TAKE these medications    acetaminophen  500 MG tablet Commonly known as: TYLENOL  Take 500-1,000 mg by mouth every 6 (six) hours as needed for moderate pain (pain score 4-6).   aspirin  EC 81 MG tablet Take 81 mg by mouth daily.   atorvastatin  80 MG tablet Commonly known as: LIPITOR  Take 1 tablet (80 mg total) by mouth daily.   carvedilol  6.25 MG tablet Commonly known as: COREG  Take 1 tablet (6.25 mg total) by mouth 2 (two) times daily.   cetirizine 10 MG tablet Commonly known as: ZYRTEC Take 10 mg by mouth  daily.   cholecalciferol 25 MCG (1000 UNIT) tablet Commonly known as: VITAMIN D3 Take 1,000 Units by mouth daily.   clopidogrel  75 MG tablet Commonly known as: PLAVIX  Take 1 tablet (75 mg total) by mouth daily.   cyanocobalamin 1000 MCG tablet Commonly known as: VITAMIN B12 Take 1,000 mcg by mouth daily.   ezetimibe  10 MG tablet Commonly known as: ZETIA  Take 10 mg by mouth daily.   isosorbide  mononitrate 30 MG 24 hr tablet Commonly known as: IMDUR  Take 1 tablet (30 mg total) by mouth daily.   multivitamin with minerals Tabs tablet Take 1 tablet by mouth daily.   nitroGLYCERIN  0.4 MG SL tablet Commonly known as: NITROSTAT  Place 1 tablet (0.4 mg total) under the tongue every 5 (five) minutes x 3 doses as needed for chest pain.  sacubitril -valsartan  24-26 MG Commonly known as: ENTRESTO  Take 1 tablet by mouth 2 (two) times daily.   ZINC PO Take 1 tablet by mouth daily as needed (when starting to feel sick.).           Allergies Allergies  Allergen Reactions   Azithromycin Other (See Comments)    Hemolytic anemia   Simvastatin      Muscle Pain    Outstanding Labs/Studies    Duration of Discharge Encounter   Greater than 30 minutes including physician time.  Signed, Anadelia Kintz N Tekesha Almgren, PA-C 03/19/2024, 11:59 AM

## 2024-03-19 NOTE — Interval H&P Note (Signed)
 History and Physical Interval Note:  03/19/2024 7:38 AM  Richard Mclaughlin  has presented today for surgery, with the diagnosis of pci.  The various methods of treatment have been discussed with the patient and family. After consideration of risks, benefits and other options for treatment, the patient has consented to  Procedure(s): CORONARY STENT INTERVENTION (N/A) Coronary Ultrasound/IVUS (N/A) CORONARY ATHERECTOMY (N/A) as a surgical intervention.  The patient's history has been reviewed, patient examined, no change in status, stable for surgery.  I have reviewed the patient's chart and labs.  Questions were answered to the patient's satisfaction.     Gordy Bergamo

## 2024-03-19 NOTE — H&P (Signed)
 CARDIOLOGY ADMIT NOTE     Patient ID: Richard Mclaughlin MRN: [id number] DOB/AGE: 1940-10-17 83 y.o.  Admit date: 03/19/2024.   Primary Physician:  Podraza, Cole Christopher, PA-C  Patient ID: Richard Mclaughlin, male    DOB: 12-Jan-1941, 83 y.o.   MRN: [id number]  Chest pain CAD  HPI:    Richard Mclaughlin  is a 83 y.o. Caucasian male patient with coronary disease, hyperglycemia/prediabetes mellitus with stage III chronic kidney disease, hypertension, hypercholesterolemia, OSA on CPAP.  Presented with late inferior and posterior STEMI and cardiac catheterization on 09/19/2023 revealing CTO RCA and CX with collaterals from LAD and proximal LAD had a 80% and D1 80 to 90% stenosis and recommended medical therapy in view of late presentation and EF 25 to 30% with consideration for revascularization of the LAD once stabilized from myocardial infarction.    Due to class II to  In spite of aggressive medical therapy and also the fact that he has proximal LAD disease with occluded CX and RCA, he was referred for evaluation for surgical bypass, however he was turned down and felt to be high risk and consensus plan was to proceed with PCI to the proximal LAD after heart team meeting.  He now presents for planned PCI.  He has been doing well with occasional chest discomfort.  He is newly married and plans to travel with his wife and wants to be more active.  After explaining all the risks and benefits, he also preferred to go through with PCI.  No change in his medications, labs reviewed.  Past Medical History:  Diagnosis Date   CKD stage 3a, GFR 45-59 ml/min (HCC) 09/19/2023   High cholesterol    Hypertension 09/19/2023   Non-insulin  dependent type 2 diabetes mellitus (HCC) 09/19/2023   OSA (obstructive sleep apnea) 09/19/2023   Past Surgical History:  Procedure Laterality Date   LEFT HEART CATH AND CORONARY ANGIOGRAPHY N/A 09/19/2023   Procedure: LEFT HEART CATH AND CORONARY  ANGIOGRAPHY;  Surgeon: [name], MD;  Location: [facility] IN[facility];  Service: Cardiovascular;  Laterality: N/A;   Social History   Tobacco Use   Smoking status: Former    Current packs/day: 0.00    Types: Cigarettes    Quit date: 06/04/1983    Years since quitting: 40.8   Smokeless tobacco: Not on file  Substance Use Topics   Alcohol use: Yes   No family history on file.  ROS  Review of Systems  Cardiovascular:  Positive for chest pain. Negative for dyspnea on exertion and leg swelling.   Objective      03/19/2024    6:00 AM 03/19/2024    5:54 AM 03/19/2024    5:00 AM  Vitals with BMI  Height 6' 1 6' 1   Weight 184 lbs  184 lbs  BMI 24.28  24.28  Systolic  117   Diastolic  68   Pulse  57       Physical Exam Neck:     Vascular: No JVD.  Cardiovascular:     Rate and Rhythm: Normal rate and regular rhythm.     Pulses: Intact distal pulses.     Heart sounds: S1 normal and S2 normal. Murmur heard.     Early systolic murmur is present with a grade of 2/6 at the upper right sternal border.     No gallop.  Pulmonary:     Effort: Pulmonary effort is normal.     Breath sounds: Normal breath sounds.  Abdominal:     General: Bowel sounds are normal.     Palpations: Abdomen is soft.  Musculoskeletal:     Right lower leg: No edema.     Left lower leg: No edema.    Laboratory examination:   Recent Labs    09/19/23 0633 09/20/23 0323 09/21/23 0348 10/09/23 1530 01/13/24 0837 03/17/24 1310  NA 134* 134* 134* 140 143 138  K 3.7 4.1 3.5 4.7 5.0 4.9  CL 102 101 100 103 105 102  CO2 22 20* 21* 22 21 22   GLUCOSE 152* 127* 115* 128* 119* 135*  BUN 17 26* 30* 20 18 24   CREATININE 1.18 1.51* 1.49* 1.30* 1.32* 1.30*  CALCIUM  9.6 9.3 9.1 9.9 9.6 9.5  GFRNONAA >60 46* 47*  --   --   --    estimated creatinine clearance is 48.7 mL/min (A) (by C-G formula based on SCr of 1.3 mg/dL (H)).     Latest Ref Rng & Units 03/17/2024    1:10 PM 01/13/2024    8:37 AM 10/09/2023     3:30 PM  CMP  Glucose 70 - 99 mg/dL 864  880  871   BUN 8 - 27 mg/dL 24  18  20    Creatinine 0.76 - 1.27 mg/dL 8.69  8.67  8.69   Sodium 134 - 144 mmol/L 138  143  140   Potassium 3.5 - 5.2 mmol/L 4.9  5.0  4.7   Chloride 96 - 106 mmol/L 102  105  103   CO2 20 - 29 mmol/L 22  21  22    Calcium  8.6 - 10.2 mg/dL 9.5  9.6  9.9   Total Protein 6.0 - 8.5 g/dL  6.4    Total Bilirubin 0.0 - 1.2 mg/dL  0.4    Alkaline Phos 44 - 121 IU/L  74    AST 0 - 40 IU/L  22    ALT 0 - 44 IU/L  18        Latest Ref Rng & Units 03/17/2024    1:10 PM 02/12/2024   10:11 AM 01/13/2024    8:37 AM  CBC  WBC 3.4 - 10.8 x10E3/uL 5.8   5.4   Hemoglobin 13.0 - 17.7 g/dL 86.7  85.7  85.3   Hematocrit 37.5 - 51.0 % 41.0  43.5  43.9   Platelets 150 - 450 x10E3/uL 161   170    Lipid Panel     Component Value Date/Time   CHOL 122 01/13/2024 0837   TRIG 166 (H) 01/13/2024 0837   HDL 35 (L) 01/13/2024 0837   CHOLHDL 3.5 01/13/2024 0837   LDLCALC 59 01/13/2024 0837   HEMOGLOBIN A1C Lab Results  Component Value Date   HGBA1C 6.4 (H) 09/19/2023   MPG 136.98 09/19/2023   TSH No results for input(s): TSH in the last 8760 hours. BNP (last 3 results) No results for input(s): BNP in the last 8760 hours. Cardiac Panel (last 3 results) No results for input(s): CKTOTAL, CKMB, TROPONINIHS, RELINDX in the last 72 hours.   Medications and allergies   Allergies  Allergen Reactions   Azithromycin Other (See Comments)    Hemolytic anemia   Simvastatin      Muscle Pain     Current Outpatient Medications  Medication Instructions   acetaminophen  (TYLENOL ) 500-1,000 mg, Oral, Every 6 hours PRN   aspirin  EC 81 mg, Oral, Daily   atorvastatin  (LIPITOR ) 80 mg, Oral, Daily   carvedilol  (COREG ) 6.25 mg, Oral, 2 times  daily   cetirizine (ZYRTEC) 10 mg, Oral, Daily   cholecalciferol (VITAMIN D3) 1,000 Units, Oral, Daily   clopidogrel  (PLAVIX ) 75 mg, Oral, Daily   cyanocobalamin (VITAMIN B12) 1,000  mcg, Oral, Daily   ezetimibe  (ZETIA ) 10 mg, Oral, Daily   isosorbide  mononitrate (IMDUR ) 30 mg, Oral, Daily   Multiple Vitamin (MULTIVITAMIN WITH MINERALS) TABS tablet 1 tablet, Oral, Daily   Multiple Vitamins-Minerals (ZINC PO) 1 tablet, Oral, Daily PRN   nitroGLYCERIN  (NITROSTAT ) 0.4 mg, Sublingual, Every 5 min x3 PRN   sacubitril -valsartan  (ENTRESTO ) 24-26 MG 1 tablet, Oral, 2 times daily    Radiology:  Imaging results have been reviewed  Cardiac Studies:   Echocardiogram 01/27/2024:  1. Limited echocardiogram - for details reference the full study 12/09/2023. 2. The inferior wall and posterior wall are akinetic. The anterior wall, antero-lateral wall, mid inferoseptal segment, apical inferior segment, and basal inferoseptal segment are hypokinetic. 3. Left ventricular ejection fraction, by estimation, is 30 to 35%. The left ventricle has moderately decreased function.    4. 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    5. Compared to 09/19/2023, EF is improved from 25%.    Cardiac Catheterization 09/19/23: Angiographic data: LV: Entire inferior and inferoapical akinesis.  LVEF 25 to 30%.  No significant mitral regurgitation. RCA: Occluded in the proximal segment.  Faint collaterals to the PDA from LAD. LM: Large-caliber vessel, normal. LAD: Diffusely diseased and mildly calcified throughout.  Proximal segment has a 80% stenosis.  Moderate-sized D-1 with ostial 80% to 90% stenosis small to moderate-sized D2 with mild 20% ostial stenosis. LCx: Occluded in the proximal segment.  OM1 which appears to be moderate to large size has faint ipsilateral collaterals.  Distal CX not seen.     Assessment   1.  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 We discussed regarding risks, benefits, alternatives to this including stress  testing, CTA and continued medical therapy. Patient wants to proceed. Understands <1-2% risk of death, stroke, MI, urgent CABG, bleeding, infection, renal failure but not limited to these.     Gordy Bergamo, MD, [facility] 03/19/2024, 7:33 AM [facility] [address] [place], [place] [address] Phone: [phone]. Fax:  [phone]

## 2024-03-19 NOTE — Progress Notes (Signed)
 Discussed with pt and daughter stent, restrictions, Plavix , diet, exercise, NTG and CRPII. Pt receptive. Will refer to [place] CRPII.  [phone] Aliene Aris BS, ACSM-CEP 03/19/2024 2:10 PM

## 2024-03-21 ENCOUNTER — Encounter (HOSPITAL_COMMUNITY): Payer: Self-pay | Admitting: Cardiology

## 2024-03-22 ENCOUNTER — Encounter (HOSPITAL_COMMUNITY)

## 2024-03-22 MED FILL — Nitroglycerin IV Soln 100 MCG/ML in D5W: INTRA_ARTERIAL | Qty: 10 | Status: AC

## 2024-03-23 ENCOUNTER — Telehealth (HOSPITAL_COMMUNITY): Payer: Self-pay

## 2024-03-23 NOTE — Telephone Encounter (Signed)
 Phase II referral. Patient previously attended cardiac rehab but was on a med hold until clearance from Dr. Ladona. Patient confirms he is still interested in cardiac rehab.  F/u 10/29 & 11/24.

## 2024-03-23 NOTE — Telephone Encounter (Signed)
 Pt insurance is active and benefits verified through [facility] Medicare Co-pay 0, DED 0/0 met, out of pocket $3,900/$2,953.23 met, co-insurance 0%. no pre-authorization required, Passport, 03/23/2024@1 :48 REF# [id number]  TCR/ICR? ICR Visit(date of service)limitation? No limit Can multiple codes be used on the same date of service/visit?(IF ITS A LIMIT) n/a  Is this a lifetime maximum or an annual maximum? annual Has the member used any of these services to date? no Is there a time limit (weeks/months) on start of program and/or program completion? no  Will contact patient to see if he is interested in the Cardiac Rehab Program. If interested, patient will need to complete follow up appt. Once completed, patient will be contacted for scheduling upon review by the RN Navigator.

## 2024-03-24 ENCOUNTER — Encounter (HOSPITAL_COMMUNITY)

## 2024-03-30 NOTE — Progress Notes (Unsigned)
 Cardiology Clinic Note   Patient Name: Richard Mclaughlin Date of Encounter: 03/31/2024  Primary Care Provider:  Podraza, Cole Christopher, PA-C Primary Cardiologist:  Gordy Bergamo, MD  Patient Profile    Richard Mclaughlin 83 year old male presents to the clinic today for follow-up evaluation of his coronary artery disease and ischemic cardiomyopathy.  Past Medical History    Past Medical History:  Diagnosis Date   CKD stage 3a, GFR 45-59 ml/min (HCC) 09/19/2023   High cholesterol    Hypertension 09/19/2023   Non-insulin  dependent type 2 diabetes mellitus (HCC) 09/19/2023   OSA (obstructive sleep apnea) 09/19/2023   Past Surgical History:  Procedure Laterality Date   CORONARY ATHERECTOMY N/A 03/19/2024   Procedure: CORONARY ATHERECTOMY;  Surgeon: Bergamo Gordy, MD;  Location: [facility] INVASIVE CV LAB;  Service: Cardiovascular;  Laterality: N/A;   CORONARY STENT INTERVENTION N/A 03/19/2024   Procedure: CORONARY STENT INTERVENTION;  Surgeon: Bergamo Gordy, MD;  Location: [facility] INVASIVE CV LAB;  Service: Cardiovascular;  Laterality: N/A;   CORONARY ULTRASOUND/IVUS N/A 03/19/2024   Procedure: Coronary Ultrasound/IVUS;  Surgeon: Bergamo Gordy, MD;  Location: [facility] INVASIVE CV LAB;  Service: Cardiovascular;  Laterality: N/A;   LEFT HEART CATH AND CORONARY ANGIOGRAPHY N/A 09/19/2023   Procedure: LEFT HEART CATH AND CORONARY ANGIOGRAPHY;  Surgeon: Bergamo Gordy, MD;  Location: [facility] INVASIVE CV LAB;  Service: Cardiovascular;  Laterality: N/A;    Allergies  Allergies  Allergen Reactions   Azithromycin Other (See Comments)    Hemolytic anemia   Simvastatin      Muscle Pain    History of Present Illness    Richard Mclaughlin has a PMH of ischemic cardiomyopathy LVEF 25%, AKI, type 2 diabetes, OSA, hyperlipidemia, and coronary artery disease.  He was a late presentation inferior and posterior STEMI.  He was noted to have multivessel coronary artery disease.  He presented to the hospital on  09/19/2023 and was discharged on 09/21/2023.  He noted epigastric pain, nausea, and his ingestion type symptoms.  His EKG showed ST elevations in inferior leads, lateral leads and ST depression in anterior leads.  His cardiac troponins were elevated at 15,000-20,000 and decreased to 15,283.  He underwent left heart catheterization which showed RCA CTO with collaterals to PDA from LAD, proximal LAD 80%, D1 80-90% stenosis, ostial circumflex chronic total occlusion.  His LV gram showed inferior and inferior apical akinesis.  His LVEF was noted to be 25-30%.  No PCI was performed due to absence of symptoms and late presentation of STEMI.  Medical management was recommended.  He was given a LifeVest at discharge.  He presented to the clinic 10/09/23 for follow-up evaluation and stated he had been slowly increasing his physical activity.  He had been walking in his neighborhood.  He reported that he walked both down and up inclines.  He had been doing well with this.  He did note increased work of breathing with increased physical activity.  His breathing recovered quickly.  His blood pressure today was 100/60.  He was euvolemic.  He was tolerating his medications well.  We reviewed the importance of low-sodium diet.   l ordered a BMP.    I was unable to uptitrate GDMT due to blood pressure.  Follow-up in 1-2 months was planned.  His BMP showed stable electrolytes and improved renal function.  He presented to the clinic 12/01/23 for follow-up evaluation and stated he has been having trouble with the heat.  We reviewed his previous clinic visit and  his medications.  We reviewed his decreased EF.  He and his wife expressed understanding.  We reviewed recommendations for not exercising in temperatures over 80 or less than 32.  He had recent lab work drawn with his PCP.  We reviewed this.  His LDL cholesterol decreased to 85.  I added ezetimibe .  His blood pressure was 114/60.    I was unable to uptitrate GDMT at that time.   I planned for repeat echocardiogram in 1 month and planned follow-up in 3 to 4 months.  He was referred for consideration of bypass surgery.  TCTS felt that he was high risk and plan for high risk PCI was made.  He underwent cardiac catheterization with IVUS guided arthrectomy followed by stenting of proximal LAD on 03/19/2024.  TIMI-3 to TIMI-3 flow was maintained.  Improvement in collaterals to circumflex and to RCA postangioplasty.  He was placed on aspirin  and Plavix  with plan to continue for 1 year and then Plavix  indefinitely.  He tolerated the procedure well.  He had no complaints post cath.  Radial site was stable.  He presents to the clinic today for follow-up evaluation and states he did have some dizziness yesterday and this morning.  He reports that he could hydrate better.  We reviewed his arthrectomy and stenting.  He and his wife expressed understanding.  We reviewed his medications.  I will continue these at current dose.  His blood pressure today is 100/50.  His EKG is reassuring.  I will refer him to the pharmacy lipid clinic for consideration of PCSK9 inhibitor.  He may resume cardiac rehab.  We will keep follow-up with Dr. Ladona in 1 month..   Today he denies chest pain, shortness of breath, lower extremity edema, fatigue, palpitations, melena, hematuria, hemoptysis, diaphoresis, weakness, presyncope, syncope, orthopnea, and PND.   Home Medications    Prior to Admission medications   Medication Sig Start Date End Date Taking? Authorizing Provider  acetaminophen  (TYLENOL ) 500 MG tablet Take 500-1,000 mg by mouth every 6 (six) hours as needed for moderate pain (pain score 4-6).    [provider]  aspirin  EC 81 MG tablet Take 81 mg by mouth daily.    [provider]  atorvastatin  (LIPITOR ) 80 MG tablet Take 1 tablet (80 mg total) by mouth daily. 09/22/23   Danford, Lonni SQUIBB, MD  carvedilol  (COREG ) 3.125 MG tablet Take 1 tablet (3.125 mg total) by mouth 2 (two)  times daily with a meal. 09/21/23   Danford, Lonni SQUIBB, MD  cetirizine (ZYRTEC) 10 MG tablet Take 10 mg by mouth daily.    [provider]  clopidogrel  (PLAVIX ) 75 MG tablet Take 1 tablet (75 mg total) by mouth daily. 09/22/23   Danford, Lonni SQUIBB, MD  cyanocobalamin (,VITAMIN B-12,) 1000 MCG/ML injection Inject 1,000 mcg into the muscle every 30 (thirty) days.  Patient not taking: Reported on 09/19/2023    [provider]  Cyanocobalamin (B-12 PO) Take 1 tablet by mouth daily.    [provider]  Multiple Vitamin (MULTIVITAMIN WITH MINERALS) TABS tablet Take 1 tablet by mouth daily.    [provider]  Multiple Vitamins-Minerals (ZINC PO) Take 1 tablet by mouth daily as needed (when starting to feel sick.).    [provider]  nitroGLYCERIN  (NITROSTAT ) 0.4 MG SL tablet Place 1 tablet (0.4 mg total) under the tongue every 5 (five) minutes x 3 doses as needed for chest pain. 09/21/23   Danford, Lonni SQUIBB, MD  sacubitril -valsartan  (ENTRESTO )  24-26 MG Take 1 tablet by mouth 2 (two) times daily. 09/21/23   Danford, Lonni SQUIBB, MD  VITAMIN D PO Take 1 tablet by mouth daily.    [provider]    Family History    History reviewed. No pertinent family history. has no family status information on file.   Social History    Social History   Socioeconomic History   Marital status: Married    Spouse name: Not on file   Number of children: Not on file   Years of education: Not on file   Highest education level: Not on file  Occupational History   Not on file  Tobacco Use   Smoking status: Former    Current packs/day: 0.00    Types: Cigarettes    Quit date: 06/04/1983    Years since quitting: 40.8   Smokeless tobacco: Not on file  Vaping Use   Vaping status: Never Used  Substance and Sexual Activity   Alcohol use: Yes   Drug use: No   Sexual activity: Yes  Other Topics Concern   Not on file  Social History Narrative   Not  on file   Social Drivers of Health   Financial Resource Strain: Not on file  Food Insecurity: Low Risk  (11/18/2023)   Received from [facility]   Hunger Vital Sign    Within the past 12 months, you worried that your food would run out before you got money to buy more: Never true    Within the past 12 months, the food you bought just didn't last and you didn't have money to get more. : Never true  Transportation Needs: No Transportation Needs (11/18/2023)   Received from [facility]    In the past 12 months, has lack of reliable transportation kept you from medical appointments, meetings, work or from getting things needed for daily living? : No  Physical Activity: Not on file  Stress: Not on file  Social Connections: Moderately Integrated (09/19/2023)   Social Connection and Isolation Panel    Frequency of Communication with Friends and Family: Once a week    Frequency of Social Gatherings with Friends and Family: More than three times a week    Attends Religious Services: 1 to 4 times per year    Active Member of [employer] or Organizations: No    Attends Banker Meetings: Never    Marital Status: Married  Catering Manager Violence: Not At Risk (09/19/2023)   Humiliation, Afraid, Rape, and Kick questionnaire    Fear of Current or Ex-Partner: No    Emotionally Abused: No    Physically Abused: No    Sexually Abused: No     Review of Systems    General:  No chills, fever, night sweats or weight changes.  Cardiovascular:  No chest pain, dyspnea on exertion, edema, orthopnea, palpitations, paroxysmal nocturnal dyspnea. Dermatological: No rash, lesions/masses Respiratory: No cough, dyspnea Urologic: No hematuria, dysuria Abdominal:   No nausea, vomiting, diarrhea, bright red blood per rectum, melena, or hematemesis Neurologic:  No visual changes, wkns, changes in mental status. All other systems reviewed and are otherwise negative except as noted  above.  Physical Exam    VS:  BP (!) 100/50   Pulse (!) 59   Ht 6' 1 (1.854 m)   Wt 190 lb 6.4 oz (86.4 kg)   SpO2 91%   BMI 25.12 kg/m  , BMI Body mass index is 25.12 kg/m. GEN: Well  nourished, well developed, in no acute distress. HEENT: normal. Neck: Supple, no JVD, carotid bruits, or masses. Cardiac: RRR, no murmurs, rubs, or gallops. No clubbing, cyanosis, edema.  Radials/DP/PT 2+ and equal bilaterally.  Respiratory:  Respirations regular and unlabored, clear to auscultation bilaterally. GI: Soft, nontender, nondistended, BS + x 4. MS: no deformity or atrophy. Skin: warm and dry, no rash. Neuro:  Strength and sensation are intact. Psych: Normal affect.  Accessory Clinical Findings    Recent Labs: 09/19/2023: Magnesium 2.0 01/13/2024: ALT 18 03/17/2024: BUN 24; Creatinine, Ser 1.30; Hemoglobin 13.2; Platelets 161; Potassium 4.9; Sodium 138   Recent Lipid Panel    Component Value Date/Time   CHOL 122 01/13/2024 0837   TRIG 166 (H) 01/13/2024 0837   HDL 35 (L) 01/13/2024 0837   CHOLHDL 3.5 01/13/2024 0837   LDLCALC 59 01/13/2024 0837         ECG personally reviewed by me today- EKG Interpretation Date/Time:  Wednesday March 31 2024 09:03:56 EDT Ventricular Rate:  60 PR Interval:  190 QRS Duration:  112 QT Interval:  416 QTC Calculation: 416 R Axis:   29  Text Interpretation: Normal sinus rhythm Inferior infarct (cited on or before 19-Mar-2024) When compared with ECG of 19-Mar-2024 09:23, Inverted T waves have replaced nonspecific T wave abnormality in Inferior leads Confirmed by Emelia Hazy [phone]) on 03/31/2024 9:06:28 AM   Echocardiogram 09/19/2023 IMPRESSIONS     1. Poor acoustic windows Definity  used. There is severe  hypokinesis/akinesis of the lateral wall, anterior wall, inferior wall.  Left ventricular ejection fraction, by estimation, is 25%. The left  ventricle has severely decreased function. There is mild  left ventricular hypertrophy.    2. Right ventricular systolic function is normal. The right ventricular  size is normal.   3. The mitral valve is normal in structure. Trivial mitral valve  regurgitation.   4. The aortic valve is tricuspid. Aortic valve regurgitation is not  visualized. Aortic valve sclerosis/calcification is present, without any  evidence of aortic stenosis.   5. The inferior vena cava is normal in size with greater than 50%  respiratory variability, suggesting right atrial pressure of 3 mmHg.   FINDINGS   Left Ventricle: Poor acoustic windows Definity  used. There is severe  hypokinesis/akinesis of the lateral wall, anterior wall, inferior wall.  Left ventricular ejection fraction, by estimation, is 25%. The left  ventricle has severely decreased function.  Definity  contrast agent was given IV to delineate the left ventricular  endocardial borders. The left ventricular internal cavity size was normal  in size. There is mild left ventricular hypertrophy.   Right Ventricle: The right ventricular size is normal. Right vetricular  wall thickness was not assessed. Right ventricular systolic function is  normal.   Left [facility]: Left atrial size was normal in size.   Right [facility]: Right atrial size was normal in size.   Pericardium: There is no evidence of pericardial effusion.   Mitral Valve: The mitral valve is normal in structure. Trivial mitral  valve regurgitation.   Tricuspid Valve: The tricuspid valve is normal in structure. Tricuspid  valve regurgitation is trivial.   Aortic Valve: The aortic valve is tricuspid. Aortic valve regurgitation is  not visualized. Aortic valve sclerosis/calcification is present, without  any evidence of aortic stenosis.   Pulmonic Valve: The pulmonic valve was normal in structure. Pulmonic valve  regurgitation is trivial.   Aorta: The aortic root and ascending aorta are structurally normal, with  no evidence of dilitation.  Venous: The inferior vena cava is  normal in size with greater than 50%  respiratory variability, suggesting right atrial pressure of 3 mmHg.   IAS/Shunts: No atrial level shunt detected by color flow Doppler.     Cardiac catheterization 09/19/2023    1st Diag lesion is 80% stenosed.   2nd Diag lesion is 20% stenosed.   Cardiac Catheterization 09/19/23: Hemodynamic data: LVEDP 18 mmHg.  Mild to moderately elevated.  No pressure gradient across the aortic valve.   Angiographic data: LV: Entire inferior and inferoapical akinesis.  LVEF 25 to 30%.  No significant mitral regurgitation. RCA: Occluded in the proximal segment.  Faint collaterals to the PDA from LAD. LM: Large-caliber vessel, normal. LAD: Diffusely diseased and mildly calcified throughout.  Proximal segment has a 80% stenosis.  Moderate-sized D-1 with ostial 80% to 90% stenosis small to moderate-sized D2 with mild 20% ostial stenosis. LCx: Occluded in the proximal segment.  OM1 which appears to be moderate to large size has faint ipsilateral collaterals.  Distal CX not seen.      Impression and recommendations: Patient presenting late with inferior and posterior STEMI, essentially living off of LAD now.  LVEF appears to be severely reduced.  Will medically optimize him, started him on Entresto , Jardiance , Coreg  and discontinue metoprolol  tartrate.  Once optimized, if he has any recurrence of angina we will consider PCI to the LAD.   Cardiac catheterization 03/19/2024  Coronary angioplasty 03/19/2024: Successful IVUS guided atherectomy and followed by stenting of proximal LAD with 3.5 x 20 mm Synergy XD DES extending into the LM, minimal luminal area increased from 2.9 mm to 7.31 mm.  TIMI-3 to TIMI-3 flow maintained.  Past improvement in collaterals to CX and specifically to RCA postangioplasty.        Recommendation: Continue aspirin  for 1 year and Plavix  indefinitely   Assessment & Plan   1. Coronary artery disease-denies chest pain today.  He was a  late presentation STEMI 09/19/2023.  He was noted to have multivessel coronary artery disease.  He has collaterals from chronic RCA to PDA from LAD, proximal LAD was 80% stenosed, D1 80-90% stenosed, ostial circumflex chronic total occlusion with collaterals from LAD.  He was seen and evaluated by T CTS.  He was not felt to be a candidate for bypass surgery.  He underwent ultrasound-guided arthrectomy and had stent placed in his proximal LAD on 03/19/2024.  He tolerated the procedure well. Continue aspirin , Plavix , atorvastatin , ezetimibe  Heart healthy low-sodium high-fiber diet-reviewed Increase physical activity as tolerated  He may proceed to cardiac rehab  Ischemic cardiomyopathy-weight today 190lbs. Maintain physical activity.  LVEF noted to be 25-30%.  On 4/25.  Follow-up echocardiogram 01/27/2024 showed LVEF of 30-35%. Continue carvedilol , Entresto , Imdur  Heart healthy low-sodium diet Continue to progress with physical activity   Hyperlipidemia-LDL 59 on 01/13/2024.  LP(a) 143.2 on 09/19/2023.  LDL 116 on 2/25.   85 on 11/18/23. Goal LDL less than 55.  High-fiber diet Continue atorvastatin , ezetimibe  Refer to pharmacy lipid clinic for consideration of PCSK9 inhibitor   AKI-creatinine 1.30 on 03/17/2024.   Follows with PCP  Type 2 diabetes-glucose 135 on 03/17/2024. Carb modified diet-reviewed Following with PCP  Disposition: Follow-up with Dr. Ganji as scheduled.   Josefa HERO. Bradin Mcadory NP-C     03/31/2024, 9:51 AM [facility] [address] Office [phone] Fax [phone]    I spent 14 minutes examining this patient, reviewing medications, and using patient centered shared decision making involving  their cardiac care.   I spent  20 minutes reviewing past medical history,  medications, and prior cardiac tests.

## 2024-03-31 ENCOUNTER — Encounter: Payer: Self-pay | Admitting: General Practice

## 2024-03-31 ENCOUNTER — Ambulatory Visit: Attending: General Practice | Admitting: General Practice

## 2024-03-31 VITALS — BP 100/50 | HR 59 | Ht 73.0 in | Wt 190.4 lb

## 2024-03-31 DIAGNOSIS — I255 Ischemic cardiomyopathy: Secondary | ICD-10-CM | POA: Diagnosis not present

## 2024-03-31 DIAGNOSIS — G4733 Obstructive sleep apnea (adult) (pediatric): Secondary | ICD-10-CM

## 2024-03-31 DIAGNOSIS — E782 Mixed hyperlipidemia: Secondary | ICD-10-CM | POA: Diagnosis not present

## 2024-03-31 DIAGNOSIS — I25118 Atherosclerotic heart disease of native coronary artery with other forms of angina pectoris: Secondary | ICD-10-CM

## 2024-03-31 DIAGNOSIS — E119 Type 2 diabetes mellitus without complications: Secondary | ICD-10-CM

## 2024-03-31 DIAGNOSIS — N179 Acute kidney failure, unspecified: Secondary | ICD-10-CM

## 2024-03-31 NOTE — Patient Instructions (Addendum)
 Medication Instructions:  Your physician recommends that you continue on your current medications as directed. Please refer to the Current Medication list given to you today. *If you need a refill on your cardiac medications before your next appointment, please call your pharmacy*  Lab Work: NONE ORDERED If you have labs (blood work) drawn today and your tests are completely normal, you will receive your results only by: MyChart Message (if you have MyChart) OR A paper copy in the mail If you have any lab test that is abnormal or we need to change your treatment, we will call you to review the results.  Testing/Procedures: NONE ORDERED  Follow-Up: At [facility], you and your health needs are our priority.  As part of our continuing mission to provide you with exceptional heart care, our providers are all part of one team.  This team includes your primary Cardiologist (physician) and Advanced Practice Providers or APPs (Physician Assistants and Nurse Practitioners) who all work together to provide you with the care you need, when you need it.  Your next appointment:   FOLLOW UP AS SCHEDULED   Provider:   Gordy Bergamo, MD   We recommend signing up for the patient portal called MyChart.  Sign up information is provided on this After Visit Summary.  MyChart is used to connect with patients for Virtual Visits (Telemedicine).  Patients are able to view lab/test results, encounter notes, upcoming appointments, etc.  Non-urgent messages can be sent to your provider as well.   To learn more about what you can do with MyChart, go to [email or web address].   Other Instructions OK TO START CARDIAC REHAB  INCREASE YOUR HYDRATATION

## 2024-04-01 ENCOUNTER — Ambulatory Visit: Attending: Student in an Organized Health Care Education/Training Program | Admitting: Pharmacist

## 2024-04-01 ENCOUNTER — Telehealth (HOSPITAL_COMMUNITY): Payer: Self-pay | Admitting: *Deleted

## 2024-04-01 DIAGNOSIS — I214 Non-ST elevation (NSTEMI) myocardial infarction: Secondary | ICD-10-CM

## 2024-04-01 DIAGNOSIS — E7800 Pure hypercholesterolemia, unspecified: Secondary | ICD-10-CM | POA: Diagnosis not present

## 2024-04-01 NOTE — Progress Notes (Signed)
 Patient ID: Richard Mclaughlin                 DOB: [birth year]                    MRN: [id number]      HPI: Richard Mclaughlin is a 83 y.o. male patient of Dr. Ladona referred to lipid clinic by Richard Mclaughlin. PMH is significant for ischemic cardiomyopathy LVEF 25%, AKI, type 2 diabetes, OSA, hyperlipidemia, and coronary artery disease s/p STEMI. He underwent ultrasound-guided arthrectomy and had stent placed in his proximal LAD on 03/19/2024. LP(a) 143.    Reviewed options for lowering LDL cholesterol, including ezetimibe , PCSK-9 inhibitors, bempedoic acid and inclisiran.  Discussed mechanisms of action, dosing, side effects and potential decreases in LDL cholesterol.  Also reviewed cost information and potential options for patient assistance.   Current Medications: atorvastatin  80mg , ezetimibe  10mg   Intolerances:  Risk Factors: age, CHF, STEMI, DM LDL-C goal: <55 ApoB goal: <70  Diet:   Exercise:   Family History:   Social History:   Labs: Lipid Panel     Component Value Date/Time   CHOL 122 01/13/2024 0837   TRIG 166 (H) 01/13/2024 0837   HDL 35 (L) 01/13/2024 0837   CHOLHDL 3.5 01/13/2024 0837   LDLCALC 59 01/13/2024 0837   LABVLDL 28 01/13/2024 0837    Past Medical History:  Diagnosis Date   CKD stage 3a, GFR 45-59 ml/min (HCC) 09/19/2023   High cholesterol    Hypertension 09/19/2023   Non-insulin  dependent type 2 diabetes mellitus (HCC) 09/19/2023   OSA (obstructive sleep apnea) 09/19/2023    Current Outpatient Medications on File Prior to Visit  Medication Sig Dispense Refill   acetaminophen  (TYLENOL ) 500 MG tablet Take 500-1,000 mg by mouth every 6 (six) hours as needed for moderate pain (pain score 4-6).     aspirin  EC 81 MG tablet Take 81 mg by mouth daily.     atorvastatin  (LIPITOR ) 80 MG tablet Take 1 tablet (80 mg total) by mouth daily. 90 tablet 3   carvedilol  (COREG ) 6.25 MG tablet Take 1 tablet (6.25 mg total) by mouth 2 (two) times daily. 60  tablet 3   cetirizine (ZYRTEC) 10 MG tablet Take 10 mg by mouth daily.     cholecalciferol (VITAMIN D3) 25 MCG (1000 UNIT) tablet Take 1,000 Units by mouth daily.     clopidogrel  (PLAVIX ) 75 MG tablet Take 1 tablet (75 mg total) by mouth daily. 90 tablet 3   cyanocobalamin (VITAMIN B12) 1000 MCG tablet Take 1,000 mcg by mouth daily.     ezetimibe  (ZETIA ) 10 MG tablet Take 10 mg by mouth daily.     isosorbide  mononitrate (IMDUR ) 30 MG 24 hr tablet Take 1 tablet (30 mg total) by mouth daily. 30 tablet 3   Multiple Vitamin (MULTIVITAMIN WITH MINERALS) TABS tablet Take 1 tablet by mouth daily.     Multiple Vitamins-Minerals (ZINC PO) Take 1 tablet by mouth daily as needed (when starting to feel sick.).     nitroGLYCERIN  (NITROSTAT ) 0.4 MG SL tablet Place 1 tablet (0.4 mg total) under the tongue every 5 (five) minutes x 3 doses as needed for chest pain. 25 tablet 2   sacubitril -valsartan  (ENTRESTO ) 24-26 MG Take 1 tablet by mouth 2 (two) times daily. 60 tablet 11   No current facility-administered medications on file prior to visit.    Allergies  Allergen Reactions   Azithromycin Other (See Comments)    Hemolytic anemia  Simvastatin      Muscle Pain    Assessment/Plan:  1. Hyperlipidemia -  No problem-specific Assessment & Plan notes found for this encounter.    Thank you,  Richard Mclaughlin, Pharm.Richard Mclaughlin, CPP [facility] [address] [place], [place] [address]  Phone: [phone]; Fax: [phone]

## 2024-04-01 NOTE — Progress Notes (Signed)
 Office Visit    Patient Name: Richard Mclaughlin Date of Encounter: 04/01/2024  Primary Care Provider:  Podraza, Cole Christopher, PA-C Primary Cardiologist:  Gordy Bergamo, MD  Chief Complaint    Hyperlipidemia   Significant Past Medical History   ischemic cardiomyopathy LVEF 25%  T2DM A1c 6.7     Allergies  Allergen Reactions   Azithromycin Other (See Comments)    Hemolytic anemia   Simvastatin      Muscle Pain    History of Present Illness    Richard Mclaughlin is a 83 y.o. male patient of Dr. Bergamo referred to lipid clinic by Josefa Beauvais. PMH is significant for ischemic cardiomyopathy LVEF 25%, AKI, type 2 diabetes, OSA, hyperlipidemia, and coronary artery disease s/p STEMI. He underwent ultrasound-guided arthrectomy and had stent placed in his proximal LAD on 03/19/2024. LP(a) 143.   Insurance Carrier: [employer] - Medicare  LDL Cholesterol goal:  <55 mg/dL  Current Medications:   Atorvastatin  80 mg daily, Ezetimibe  10 mg daily  Previously tried:  Simvastatin - muscle pain  Family Hx:    Mother : Diabetes Father(deceased): Heart attack in his 47-80's  Diet:  Eat 3 meals a day mainly consisting of chicken and vegetables. Limiting salt and sugar intake. Drinking mainly water throuhg out the day, with coffee in the morning and 1 dark beer before bedtime.   Exercise: Walking 1 mile with dogs daily  Accessory Clinical Findings   Lab Results  Component Value Date   CHOL 122 01/13/2024   HDL 35 (L) 01/13/2024   LDLCALC 59 01/13/2024   TRIG 166 (H) 01/13/2024   CHOLHDL 3.5 01/13/2024    Lipoprotein (a)  Date/Time Value Ref Range Status  09/19/2023 06:33 AM 143.2 (H) <75.0 nmol/L Final    Comment:    (NOTE) Note:  Values greater than or equal to 75.0 nmol/L may       indicate an independent risk factor for CHD,       but must be evaluated with caution when applied       to non-Caucasian populations due to the       influence of genetic factors  on Lp(a) across       ethnicities. Performed At: [facility] [address] [place], [place] [id number] Jennette Shorter MD Ey:[phone]     Lab Results  Component Value Date   ALT 18 01/13/2024   AST 22 01/13/2024   ALKPHOS 74 01/13/2024   BILITOT 0.4 01/13/2024   Lab Results  Component Value Date   CREATININE 1.30 (H) 03/17/2024   BUN 24 03/17/2024   NA 138 03/17/2024   K 4.9 03/17/2024   CL 102 03/17/2024   CO2 22 03/17/2024   Lab Results  Component Value Date   HGBA1C 6.4 (H) 09/19/2023    Home Medications    Current Outpatient Medications  Medication Sig Dispense Refill   acetaminophen  (TYLENOL ) 500 MG tablet Take 500-1,000 mg by mouth every 6 (six) hours as needed for moderate pain (pain score 4-6).     aspirin  EC 81 MG tablet Take 81 mg by mouth daily.     atorvastatin  (LIPITOR ) 80 MG tablet Take 1 tablet (80 mg total) by mouth daily. 90 tablet 3   carvedilol  (COREG ) 6.25 MG tablet Take 1 tablet (6.25 mg total) by mouth 2 (two) times daily. 60 tablet 3   cetirizine (ZYRTEC) 10 MG tablet Take 10 mg by mouth daily.     cholecalciferol (VITAMIN D3) 25 MCG (  1000 UNIT) tablet Take 1,000 Units by mouth daily.     clopidogrel  (PLAVIX ) 75 MG tablet Take 1 tablet (75 mg total) by mouth daily. 90 tablet 3   cyanocobalamin (VITAMIN B12) 1000 MCG tablet Take 1,000 mcg by mouth daily.     ezetimibe  (ZETIA ) 10 MG tablet Take 10 mg by mouth daily.     isosorbide  mononitrate (IMDUR ) 30 MG 24 hr tablet Take 1 tablet (30 mg total) by mouth daily. 30 tablet 3   Multiple Vitamin (MULTIVITAMIN WITH MINERALS) TABS tablet Take 1 tablet by mouth daily.     Multiple Vitamins-Minerals (ZINC PO) Take 1 tablet by mouth daily as needed (when starting to feel sick.).     nitroGLYCERIN  (NITROSTAT ) 0.4 MG SL tablet Place 1 tablet (0.4 mg total) under the tongue every 5 (five) minutes x 3 doses as needed for chest pain. 25 tablet 2   sacubitril -valsartan  (ENTRESTO ) 24-26 MG Take 1 tablet  by mouth 2 (two) times daily. 60 tablet 11   No current facility-administered medications for this visit.     Assessment & Plan    Hyperlipidemia  Assessment:  - Most recent LDL is 59 mg/dL, above goal of 55 mg/dL  - Explained the reasoning for his goal - Reviewed the cycle of cholesterol, how its made and where it comes from. Also the reviewed his LP(a) levels and explained the  function of LP(a) and what it is. Answered all question patient had.   - Currently taking atorvastatin  and ezetimibe , does have some muscle cramps in the legs.  - Reviewed Repatha, its benefit in elevated LP(a) - greater risk reductions seen in Fourier trial, can decrease LP(a) and better time to benefit than zetia  Plan: - Patient is opened to the idea of taking Repatha. Currently satisfied with the result of atorvastatin  and ezetimibe , and wants time to think about their options.   - Patient will contact us  about their decision.    Shirlee GORMAN Langdon, PharmD Student [address]   [place], [place] [address] [phone]  04/01/2024, 11:24 AM

## 2024-04-01 NOTE — Telephone Encounter (Signed)
 Chart review. Patient has been cleared by [facility] cardiology to proceed with exercise at cardiac rehab. Patient ready for scheduling.Hadassah Elpidio Quan RN BSN

## 2024-04-06 ENCOUNTER — Telehealth (HOSPITAL_COMMUNITY): Payer: Self-pay

## 2024-04-06 NOTE — Telephone Encounter (Signed)
 Attempted to call patient to schedule cardiac rehab- no answer, left message. Sent MyChart message.

## 2024-04-21 ENCOUNTER — Telehealth (HOSPITAL_COMMUNITY): Payer: Self-pay

## 2024-04-21 NOTE — Telephone Encounter (Signed)
 Attempted f/u call to schedule cardiac rehab- no answer, left message.  Closing referral.

## 2024-04-26 ENCOUNTER — Encounter: Payer: Self-pay | Admitting: Cardiology

## 2024-04-26 ENCOUNTER — Ambulatory Visit: Attending: Cardiology | Admitting: Cardiology

## 2024-04-26 VITALS — BP 112/52 | HR 62 | Ht 72.0 in | Wt 193.0 lb

## 2024-04-26 DIAGNOSIS — I2510 Atherosclerotic heart disease of native coronary artery without angina pectoris: Secondary | ICD-10-CM

## 2024-04-26 DIAGNOSIS — I255 Ischemic cardiomyopathy: Secondary | ICD-10-CM

## 2024-04-26 DIAGNOSIS — E782 Mixed hyperlipidemia: Secondary | ICD-10-CM | POA: Diagnosis not present

## 2024-04-26 NOTE — Progress Notes (Signed)
 Cardiology Office Note:  .   Date:  04/26/2024  ID:  Richard Mclaughlin, DOB 1941/01/25, MRN [id number] PCP: [name], PA-C  [facility] Providers Cardiologist:  Gordy Bergamo, MD   History of Present Illness: .   Richard Mclaughlin is a 83 y.o. Caucasian male patient with coronary disease, hyperglycemia/prediabetes mellitus with stage III chronic kidney disease, hypertension, hypercholesterolemia, OSA on CPAP. Presented with late inferior and posterior STEMI and cardiac catheterization on 09/19/2023 revealing CTO RCA and CX with collaterals from LAD and proximal LAD had a 80% and D1 80 to 90% stenosis and recommended medical therapy in view of late presentation and EF 25 to 30% with consideration for revascularization of the LAD once stabilized from myocardial infarction. He underwent high risk and complex intervention to distal left main and proximal LAD with implantation of a 3.5 x 20 mm Synergy XD DES on 03/19/2024.  He remains asymptomatic since angioplasty with no recurrence of chest pain.    Discussed the use of AI scribe software for clinical note transcription with the patient, who gave verbal consent to proceed.  History of Present Illness Richard Mclaughlin is an 83 year old male with coronary artery disease and chronic heart failure who presents for follow-up after angioplasty.  He experiences significant improvement in symptoms following stent placement in the left main and LAD arteries. He no longer has exertional chest pain and can perform activities like preparing for company and yard work without discomfort.  He is currently on baby aspirin  81 mg once daily, Plavix  75 mg once daily, Imdur  30 mg once daily, carvedilol  6.25 mg twice daily, atorvastatin  80 mg once daily, and Entresto  24/26 mg once daily, with no complications from these medications.  Cardiac Studies relevent.    Echocardiogram 01/27/2024:  1. Limited echocardiogram - for  details reference the full study 12/09/2023. 2. The inferior wall and posterior wall are akinetic. The anterior wall, antero-lateral wall, mid inferoseptal segment, apical inferior segment, and basal inferoseptal segment are hypokinetic. 3. Left ventricular ejection fraction, by estimation, is 30 to 35%. The left ventricle has moderately decreased function.    4. 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    5. Compared to 09/19/2023, EF is improved from 25%.   Coronary angioplasty 03/19/2024: Successful IVUS guided atherectomy and followed by stenting of proximal LAD with 3.5 x 20 mm Synergy XD DES extending into the LM, minimal luminal area increased from 2.9 mm to 7.31 mm.  TIMI-3 to TIMI-3 flow maintained.  Improvement in collaterals to CX and specifically to RCA post-angioplasty.       Recommendation: Continue aspirin  for 1 year and Plavix  indefinitely.  Labs   Lab Results  Component Value Date   CHOL 122 01/13/2024   HDL 35 (L) 01/13/2024   LDLCALC 59 01/13/2024   TRIG 166 (H) 01/13/2024   CHOLHDL 3.5 01/13/2024   Lipoprotein (a)  Date/Time Value Ref Range Status  09/19/2023 06:33 AM 143.2 (H) <75.0 nmol/L Final    Comment:    (NOTE) Note:  Values greater than or equal to 75.0 nmol/L may       indicate an independent risk factor for CHD,       but must be evaluated with caution when applied       to non-Caucasian populations due to the       influence of genetic factors on Lp(a) across       ethnicities. Performed At: [facility] [employer] [address]  [address] [place], [place] [id number] Jennette Shorter MD Ey:[phone]     Recent Labs    09/19/23 [phone] 09/20/23 0323 09/21/23 0348 10/09/23 1530 01/13/24 0837 03/17/24 1310  NA 134* 134* 134* 140 143 138  K 3.7 4.1 3.5 4.7 5.0 4.9  CL 102 101 100 103 105 102  CO2 22 20* 21* 22 21 22   GLUCOSE 152* 127* 115* 128* 119* 135*  BUN 17 26* 30* 20 18 24   CREATININE 1.18 1.51* 1.49* 1.30* 1.32* 1.30*  CALCIUM  9.6 9.3 9.1 9.9 9.6  9.5  GFRNONAA >60 46* 47*  --   --   --     Lab Results  Component Value Date   ALT 18 01/13/2024   AST 22 01/13/2024   ALKPHOS 74 01/13/2024   BILITOT 0.4 01/13/2024      Latest Ref Rng & Units 03/17/2024    1:10 PM 02/12/2024   10:11 AM 01/13/2024    8:37 AM  CBC  WBC 3.4 - 10.8 x10E3/uL 5.8   5.4   Hemoglobin 13.0 - 17.7 g/dL 86.7  85.7  85.3   Hematocrit 37.5 - 51.0 % 41.0  43.5  43.9   Platelets 150 - 450 x10E3/uL 161   170    Lab Results  Component Value Date   HGBA1C 6.4 (H) 09/19/2023    No results found for: TSH   ROS  Review of Systems  Cardiovascular:  Negative for chest pain, dyspnea on exertion and leg swelling.   Physical Exam:   VS:  BP (!) 112/52 (BP Location: Left Arm, Patient Position: Sitting, Cuff Size: Normal)   Pulse 62   Ht 6' (1.829 m)   Wt 193 lb (87.5 kg)   SpO2 94%   BMI 26.18 kg/m    Wt Readings from Last 3 Encounters:  04/26/24 193 lb (87.5 kg)  03/31/24 190 lb 6.4 oz (86.4 kg)  03/19/24 184 lb (83.5 kg)    BP Readings from Last 3 Encounters:  04/26/24 (!) 112/52  03/31/24 (!) 100/50  03/19/24 (!) 109/54   Physical Exam Neck:     Vascular: No carotid bruit or JVD.  Cardiovascular:     Rate and Rhythm: Normal rate and regular rhythm.     Pulses: Intact distal pulses.     Heart sounds: Normal heart sounds. No murmur heard.    No gallop.  Pulmonary:     Effort: Pulmonary effort is normal.     Breath sounds: Normal breath sounds.  Abdominal:     General: Bowel sounds are normal.     Palpations: Abdomen is soft.  Musculoskeletal:     Right lower leg: No edema.     Left lower leg: No edema.    EKG:         ASSESSMENT AND PLAN: .      ICD-10-CM   1. Coronary artery disease involving native coronary artery of native heart without angina pectoris  I25.10 ECHOCARDIOGRAM COMPLETE    2. Ischemic cardiomyopathy  I25.5 ECHOCARDIOGRAM COMPLETE    3. Mixed hyperlipidemia  E78.2      Assessment & Plan Atherosclerotic heart  disease of native coronary artery status post left main and LAD stenting Status post left main and LAD stenting on October 17th, 2025. No current chest pain, indicating successful intervention. No complications from the procedure or medications. - Continue baby aspirin  81 mg once daily - Continue clopidogrel  75 mg once daily - Continue Imdur  30 mg once daily - Continue carvedilol  6.25 mg twice  daily - Will schedule echocardiogram in six months to assess heart function  Ischemic cardiomyopathy with reduced ejection fraction Heart function reduced to approximately 30-35%. Currently on Entresto , but unable to increase dose due to low blood pressure. Condition is well-managed with current medications. - Continue Entresto  24/26 mg once daily - Will schedule echocardiogram in six months to assess heart function  Mixed hyperlipidemia Cholesterol levels are well controlled with current medication regimen. LDL is at 59 mg/dL. - Continue atorvastatin  80 mg once daily  Chronic kidney disease, stage III a Mild kidney dysfunction that has remained stable over time. - Continue current management and monitoring   Follow up: 6 months with repeat echo Signed,  Gordy Bergamo, MD, [facility] 04/26/2024, 8:26 PM [facility] [address] [place], [place] [address] Phone: [phone]. Fax:  [phone]

## 2024-04-26 NOTE — Patient Instructions (Signed)
 Medication Instructions:  Your physician recommends that you continue on your current medications as directed. Please refer to the Current Medication list given to you today.  *If you need a refill on your cardiac medications before your next appointment, please call your pharmacy*  Lab Work: NONE ordered at this time of appointment   Testing/Procedures: Your physician has requested that you have an echocardiogram in 6 months. Echocardiography is a painless test that uses sound waves to create images of your heart. It provides your doctor with information about the size and shape of your heart and how well your heart's chambers and valves are working. This procedure takes approximately one hour. There are no restrictions for this procedure. Please do NOT wear cologne, perfume, aftershave, or lotions (deodorant is allowed). Please arrive 15 minutes prior to your appointment time.  Please note: We ask at that you not bring children with you during ultrasound (echo/ vascular) testing. Due to room size and safety concerns, children are not allowed in the ultrasound rooms during exams. Our front office staff cannot provide observation of children in our lobby area while testing is being conducted. An adult accompanying a patient to their appointment will only be allowed in the ultrasound room at the discretion of the ultrasound technician under special circumstances. We apologize for any inconvenience.   Follow-Up: At [facility], you and your health needs are our priority.  As part of our continuing mission to provide you with exceptional heart care, our providers are all part of one team.  This team includes your primary Cardiologist (physician) and Advanced Practice Providers or APPs (Physician Assistants and Nurse Practitioners) who all work together to provide you with the care you need, when you need it.  Your next appointment:   6 month(s)  Provider:   Gordy Bergamo, MD    We  recommend signing up for the patient portal called MyChart.  Sign up information is provided on this After Visit Summary.  MyChart is used to connect with patients for Virtual Visits (Telemedicine).  Patients are able to view lab/test results, encounter notes, upcoming appointments, etc.  Non-urgent messages can be sent to your provider as well.   To learn more about what you can do with MyChart, go to [email or web address].

## 2024-05-10 ENCOUNTER — Other Ambulatory Visit: Payer: Self-pay

## 2024-05-13 MED ORDER — ISOSORBIDE MONONITRATE ER 30 MG PO TB24: 30.0000 mg | ORAL_TABLET | Freq: Every day | ORAL | 3 refills | Status: AC

## 2024-05-13 MED ORDER — CARVEDILOL 6.25 MG PO TABS: 6.2500 mg | ORAL_TABLET | Freq: Two times a day (BID) | ORAL | 3 refills | Status: AC

## 2024-06-17 ENCOUNTER — Telehealth (HOSPITAL_COMMUNITY): Payer: Self-pay

## 2024-06-17 NOTE — Telephone Encounter (Signed)
 Pt insurance is active and benefits verified through [employer]. Co-pay 0, DED 0/0 met, out of pocket 3900.00/0 met, co-insurance 0. No pre-authorization required. Samule R 06/17/2024@ 1:00REF#   [id number],

## 2024-06-23 ENCOUNTER — Telehealth (HOSPITAL_COMMUNITY): Payer: Self-pay

## 2024-06-23 NOTE — Telephone Encounter (Signed)
 Called patient to see if he was interested in participating in the Cardiac Rehab Program. Patient will come in for orientation on 2/03 and will attend the 10:15 exercise class.  Pensions consultant.

## 2024-07-02 ENCOUNTER — Telehealth (HOSPITAL_COMMUNITY): Payer: Self-pay | Admitting: *Deleted

## 2024-07-02 NOTE — Telephone Encounter (Signed)
Confirmed appointment. Completed health history.Dorrian Doggett Walden Danijah Noh RN BSN  

## 2024-07-06 ENCOUNTER — Encounter (HOSPITAL_COMMUNITY)
Admission: RE | Admit: 2024-07-06 | Discharge: 2024-07-06 | Disposition: A | Source: Ambulatory Visit | Attending: Cardiology

## 2024-07-06 ENCOUNTER — Encounter (HOSPITAL_COMMUNITY): Payer: Self-pay

## 2024-07-06 VITALS — BP 98/60 | HR 63 | Ht 73.25 in | Wt 194.9 lb

## 2024-07-06 DIAGNOSIS — Z955 Presence of coronary angioplasty implant and graft: Secondary | ICD-10-CM

## 2024-07-06 HISTORY — DX: Atherosclerotic heart disease of native coronary artery without angina pectoris: I25.10

## 2024-07-06 LAB — GLUCOSE, CAPILLARY: Glucose-Capillary: 174 mg/dL — ABNORMAL HIGH (ref 70–99)

## 2024-07-06 NOTE — Progress Notes (Signed)
 Cardiac Rehab Medication Review by a Nurse  Does the patient  feel that his/her medications are working for him/her?  yes  Has the patient been experiencing any side effects to the medications prescribed?  no  Does the patient measure his/her own blood pressure or blood glucose at home?  yes   Does the patient have any problems obtaining medications due to transportation or finances?   no  Understanding of regimen: good Understanding of indications: good Potential of compliance: good    Nurse comments: [name] is taking his medications as prescribed. [name] checks his blood pressures daily. [name] does not check his CBG's on a regular basis as he is diet controlled.    Hadassah Gaw [facility] RN 07/06/2024 8:13 AM

## 2024-07-12 ENCOUNTER — Encounter (HOSPITAL_COMMUNITY)

## 2024-07-14 ENCOUNTER — Encounter (HOSPITAL_COMMUNITY)

## 2024-07-16 ENCOUNTER — Encounter (HOSPITAL_COMMUNITY)

## 2024-07-26 ENCOUNTER — Encounter (HOSPITAL_COMMUNITY)

## 2024-07-28 ENCOUNTER — Encounter (HOSPITAL_COMMUNITY)

## 2024-07-30 ENCOUNTER — Encounter (HOSPITAL_COMMUNITY)

## 2024-08-02 ENCOUNTER — Encounter (HOSPITAL_COMMUNITY)

## 2024-08-04 ENCOUNTER — Encounter (HOSPITAL_COMMUNITY)

## 2024-08-06 ENCOUNTER — Encounter (HOSPITAL_COMMUNITY)

## 2024-08-09 ENCOUNTER — Encounter (HOSPITAL_COMMUNITY)

## 2024-08-11 ENCOUNTER — Encounter (HOSPITAL_COMMUNITY)

## 2024-08-13 ENCOUNTER — Encounter (HOSPITAL_COMMUNITY)

## 2024-08-16 ENCOUNTER — Encounter (HOSPITAL_COMMUNITY)

## 2024-08-18 ENCOUNTER — Encounter (HOSPITAL_COMMUNITY)

## 2024-08-20 ENCOUNTER — Encounter (HOSPITAL_COMMUNITY)

## 2024-08-23 ENCOUNTER — Encounter (HOSPITAL_COMMUNITY)

## 2024-08-24 ENCOUNTER — Ambulatory Visit: Admitting: Cardiology

## 2024-08-25 ENCOUNTER — Encounter (HOSPITAL_COMMUNITY)

## 2024-08-27 ENCOUNTER — Encounter (HOSPITAL_COMMUNITY)

## 2024-08-30 ENCOUNTER — Encounter (HOSPITAL_COMMUNITY)

## 2024-09-01 ENCOUNTER — Encounter (HOSPITAL_COMMUNITY)

## 2024-09-03 ENCOUNTER — Encounter (HOSPITAL_COMMUNITY)

## 2024-09-06 ENCOUNTER — Encounter (HOSPITAL_COMMUNITY)

## 2024-09-08 ENCOUNTER — Encounter (HOSPITAL_COMMUNITY)

## 2024-09-10 ENCOUNTER — Encounter (HOSPITAL_COMMUNITY)

## 2024-09-13 ENCOUNTER — Encounter (HOSPITAL_COMMUNITY)

## 2024-09-15 ENCOUNTER — Encounter (HOSPITAL_COMMUNITY)

## 2024-09-17 ENCOUNTER — Encounter (HOSPITAL_COMMUNITY)

## 2024-09-20 ENCOUNTER — Encounter (HOSPITAL_COMMUNITY)

## 2024-09-22 ENCOUNTER — Encounter (HOSPITAL_COMMUNITY)

## 2024-09-24 ENCOUNTER — Encounter (HOSPITAL_COMMUNITY)

## 2024-09-27 ENCOUNTER — Encounter (HOSPITAL_COMMUNITY)

## 2024-09-29 ENCOUNTER — Encounter (HOSPITAL_COMMUNITY)

## 2024-10-26 ENCOUNTER — Ambulatory Visit (HOSPITAL_COMMUNITY)
